# Patient Record
Sex: Female | Born: 1945 | Race: White | Hispanic: No | Marital: Married | Smoking: Never smoker
Health system: Southern US, Community
[De-identification: ages and names within clinical notes are randomized; demographics above are authoritative.]

## PROBLEM LIST (undated history)

## (undated) DIAGNOSIS — C801 Malignant (primary) neoplasm, unspecified: Secondary | ICD-10-CM

## (undated) DIAGNOSIS — Z923 Personal history of irradiation: Secondary | ICD-10-CM

## (undated) DIAGNOSIS — Z9221 Personal history of antineoplastic chemotherapy: Secondary | ICD-10-CM

## (undated) DIAGNOSIS — H269 Unspecified cataract: Secondary | ICD-10-CM

## (undated) DIAGNOSIS — C50919 Malignant neoplasm of unspecified site of unspecified female breast: Secondary | ICD-10-CM

## (undated) HISTORY — PX: BREAST BIOPSY: SHX20

## (undated) HISTORY — DX: Unspecified cataract: H26.9

## (undated) HISTORY — DX: Malignant (primary) neoplasm, unspecified: C80.1

## (undated) HISTORY — PX: ABDOMINAL HYSTERECTOMY: SHX81

---

## 1998-01-29 DIAGNOSIS — Z9221 Personal history of antineoplastic chemotherapy: Secondary | ICD-10-CM

## 1998-01-29 HISTORY — DX: Personal history of antineoplastic chemotherapy: Z92.21

## 1998-07-18 ENCOUNTER — Encounter: Payer: Self-pay | Admitting: General Surgery

## 1998-07-18 ENCOUNTER — Ambulatory Visit (HOSPITAL_COMMUNITY): Admission: RE | Admit: 1998-07-18 | Discharge: 1998-07-18 | Payer: Self-pay | Admitting: General Surgery

## 1998-07-18 DIAGNOSIS — C50919 Malignant neoplasm of unspecified site of unspecified female breast: Secondary | ICD-10-CM

## 1998-07-18 HISTORY — PX: BREAST LUMPECTOMY: SHX2

## 1998-07-18 HISTORY — DX: Malignant neoplasm of unspecified site of unspecified female breast: C50.919

## 1998-07-25 ENCOUNTER — Encounter: Payer: Self-pay | Admitting: General Surgery

## 1998-07-26 ENCOUNTER — Ambulatory Visit (HOSPITAL_COMMUNITY): Admission: RE | Admit: 1998-07-26 | Discharge: 1998-07-26 | Payer: Self-pay | Admitting: General Surgery

## 1998-07-26 ENCOUNTER — Encounter: Payer: Self-pay | Admitting: General Surgery

## 1998-08-08 ENCOUNTER — Encounter: Admission: RE | Admit: 1998-08-08 | Discharge: 1998-11-06 | Payer: Self-pay | Admitting: Radiation Oncology

## 1999-01-30 DIAGNOSIS — Z923 Personal history of irradiation: Secondary | ICD-10-CM

## 1999-01-30 HISTORY — DX: Personal history of irradiation: Z92.3

## 1999-02-06 ENCOUNTER — Encounter: Admission: RE | Admit: 1999-02-06 | Discharge: 1999-05-07 | Payer: Self-pay | Admitting: Radiation Oncology

## 1999-02-09 ENCOUNTER — Encounter: Payer: Self-pay | Admitting: Hematology and Oncology

## 1999-02-09 ENCOUNTER — Encounter: Admission: RE | Admit: 1999-02-09 | Discharge: 1999-02-09 | Payer: Self-pay | Admitting: Hematology and Oncology

## 1999-06-12 ENCOUNTER — Other Ambulatory Visit: Admission: RE | Admit: 1999-06-12 | Discharge: 1999-06-12 | Payer: Self-pay | Admitting: Family Medicine

## 1999-08-16 ENCOUNTER — Encounter: Admission: RE | Admit: 1999-08-16 | Discharge: 1999-08-16 | Payer: Self-pay | Admitting: Hematology and Oncology

## 1999-08-16 ENCOUNTER — Encounter: Payer: Self-pay | Admitting: Hematology and Oncology

## 1999-12-06 ENCOUNTER — Ambulatory Visit (HOSPITAL_COMMUNITY): Admission: RE | Admit: 1999-12-06 | Discharge: 1999-12-06 | Payer: Self-pay | Admitting: Gastroenterology

## 2000-08-12 ENCOUNTER — Ambulatory Visit (HOSPITAL_COMMUNITY): Admission: RE | Admit: 2000-08-12 | Discharge: 2000-08-12 | Payer: Self-pay | Admitting: Hematology and Oncology

## 2000-08-12 ENCOUNTER — Encounter: Payer: Self-pay | Admitting: Hematology and Oncology

## 2000-08-19 ENCOUNTER — Encounter: Payer: Self-pay | Admitting: Hematology and Oncology

## 2000-08-19 ENCOUNTER — Encounter: Admission: RE | Admit: 2000-08-19 | Discharge: 2000-08-19 | Payer: Self-pay | Admitting: Hematology and Oncology

## 2001-08-21 ENCOUNTER — Encounter: Admission: RE | Admit: 2001-08-21 | Discharge: 2001-08-21 | Payer: Self-pay | Admitting: Hematology and Oncology

## 2001-08-21 ENCOUNTER — Encounter: Payer: Self-pay | Admitting: Hematology and Oncology

## 2002-09-09 ENCOUNTER — Encounter: Admission: RE | Admit: 2002-09-09 | Discharge: 2002-09-09 | Payer: Self-pay | Admitting: General Surgery

## 2002-09-09 ENCOUNTER — Encounter: Payer: Self-pay | Admitting: General Surgery

## 2003-09-20 ENCOUNTER — Encounter: Admission: RE | Admit: 2003-09-20 | Discharge: 2003-09-20 | Payer: Self-pay | Admitting: General Surgery

## 2004-10-03 ENCOUNTER — Encounter: Admission: RE | Admit: 2004-10-03 | Discharge: 2004-10-03 | Payer: Self-pay | Admitting: General Surgery

## 2004-11-17 ENCOUNTER — Encounter: Admission: RE | Admit: 2004-11-17 | Discharge: 2004-11-17 | Payer: Self-pay | Admitting: General Surgery

## 2005-10-10 ENCOUNTER — Encounter: Admission: RE | Admit: 2005-10-10 | Discharge: 2005-10-10 | Payer: Self-pay | Admitting: General Surgery

## 2006-10-14 ENCOUNTER — Encounter: Admission: RE | Admit: 2006-10-14 | Discharge: 2006-10-14 | Payer: Self-pay | Admitting: Emergency Medicine

## 2007-10-23 ENCOUNTER — Encounter: Admission: RE | Admit: 2007-10-23 | Discharge: 2007-10-23 | Payer: Self-pay | Admitting: Emergency Medicine

## 2007-10-29 ENCOUNTER — Other Ambulatory Visit: Admission: RE | Admit: 2007-10-29 | Discharge: 2007-10-29 | Payer: Self-pay | Admitting: Obstetrics and Gynecology

## 2008-10-28 ENCOUNTER — Encounter: Admission: RE | Admit: 2008-10-28 | Discharge: 2008-10-28 | Payer: Self-pay | Admitting: Obstetrics and Gynecology

## 2009-05-12 ENCOUNTER — Other Ambulatory Visit: Admission: RE | Admit: 2009-05-12 | Discharge: 2009-05-12 | Payer: Self-pay | Admitting: Obstetrics and Gynecology

## 2009-09-02 ENCOUNTER — Ambulatory Visit (HOSPITAL_COMMUNITY): Admission: RE | Admit: 2009-09-02 | Discharge: 2009-09-02 | Payer: Self-pay | Admitting: Orthopedic Surgery

## 2009-11-10 ENCOUNTER — Encounter: Admission: RE | Admit: 2009-11-10 | Discharge: 2009-11-10 | Payer: Self-pay | Admitting: Family Medicine

## 2010-02-19 ENCOUNTER — Encounter: Payer: Self-pay | Admitting: General Surgery

## 2010-05-31 ENCOUNTER — Ambulatory Visit (HOSPITAL_BASED_OUTPATIENT_CLINIC_OR_DEPARTMENT_OTHER)
Admission: RE | Admit: 2010-05-31 | Discharge: 2010-05-31 | Disposition: A | Discharge status: Home / Self Care | Payer: Medicare Other | Source: Ambulatory Visit | Attending: Orthopedic Surgery | Admitting: Orthopedic Surgery

## 2010-05-31 DIAGNOSIS — Z01810 Encounter for preprocedural cardiovascular examination: Secondary | ICD-10-CM | POA: Insufficient documentation

## 2010-05-31 DIAGNOSIS — M2240 Chondromalacia patellae, unspecified knee: Secondary | ICD-10-CM | POA: Insufficient documentation

## 2010-05-31 DIAGNOSIS — X58XXXA Exposure to other specified factors, initial encounter: Secondary | ICD-10-CM | POA: Insufficient documentation

## 2010-05-31 DIAGNOSIS — Z01812 Encounter for preprocedural laboratory examination: Secondary | ICD-10-CM | POA: Insufficient documentation

## 2010-05-31 DIAGNOSIS — S83289A Other tear of lateral meniscus, current injury, unspecified knee, initial encounter: Secondary | ICD-10-CM | POA: Insufficient documentation

## 2010-05-31 LAB — POCT I-STAT 4, (NA,K, GLUC, HGB,HCT)
Glucose, Bld: 98 mg/dL (ref 70–99)
HCT: 39 % (ref 36.0–46.0)
Hemoglobin: 13.3 g/dL (ref 12.0–15.0)
Potassium: 3.9 mEq/L (ref 3.5–5.1)
Sodium: 137 mEq/L (ref 135–145)

## 2010-06-05 NOTE — Op Note (Signed)
Virginia Sanchez, SETTLE NO.:  [id number]  MEDICAL RECORD NO.:  [id number]           PATIENT TYPE:  LOCATION:                                 FACILITY:  PHYSICIAN:  Ollen Gross, M.D.         DATE OF BIRTH:  DATE OF PROCEDURE:  05/31/2010 DATE OF DISCHARGE:                              OPERATIVE REPORT   PREOPERATIVE DIAGNOSIS:  Left knee lateral meniscal tear.  POSTOPERATIVE DIAGNOSES:  Left knee lateral meniscal tear plus chondral defects medial and lateral compartments.  PROCEDURE:  Left knee arthroscopy with meniscal debridement and chondroplasty.  SURGEON:  Ollen Gross, M.D.  ASSISTANT:  None.  ANESTHESIA:  General.  BLOOD LOSS:  Minimal.  DRAINS:  None.  COMPLICATIONS:  None.  CONDITION:  Stable to recovery.  BRIEF CLINICAL NOTE:  [name] is a 65 year old female with a long history of significant pain, mechanical symptoms in the left knee.  Exam and history suggested lateral meniscal tear confirmed by MRI.  There is also a question of a medial tear in the MRI.  She presents now for arthroscopy and debridement.  PROCEDURE IN DETAIL:  After successful administration of general anesthetic, the patient's left lower extremity was prepped and draped in the usual sterile fashion.  Standard superomedial and inferolateral incisions were made, inflow cannula passed, superomedial cannula passed inferolateral.  Arthroscopic visualization proceeds.  Undersurface of patella, had some grade 1 and grade 2 change but no full-thickness defects.  No unstable cartilage.  The trochlea had some mild changes also, but no full thickness cartilaginous defects and no unstable cartilage.  The mediolateral gutters were visualized and no loose bodies.  Flexion valgus force applied to knee, and the medial compartment was entered, about 1 x 1 cm area where this cartilage coming off the medial femoral condyle.  The spinal needle was used to localize the  inferomedial portal.  Small incision made and dilator placed.  Then debrided the chondral defect back to stable base with stable cartilaginous edges.  The meniscus was inspected, and there were no tears in the medial meniscus.  Intercondylar notch was visualized.  The ACL was normal.  Lateral compartment was entered, and there was a significant amount of chondromalacia and unstable cartilage on the tibial plateau surface, but not on the femoral side.  I debrided this back to stable cartilaginous base on the tibial plateau with a shaver. There was a tear in the anterior horn and body of the lateral meniscus. It was debrided back to stable base with the shaver and the baskets and sealed off with the ArthroCare.  The rest of the lateral compartment did not have abnormality.  The joints again inspected.  No other tears, defects, or loose bodies were noted.  The arthroscopic clips were removed from the inferior portals, which were closed with interrupted 4- 0 nylon.  A 20 cc of 0.25% Marcaine with epinephrine injected through the inflow cannula, __________ removed and portal closed with nylon. Incisions were cleaned and dried and a bulky sterile dressing was applied.  She was then awakened and transported to recovery in stable condition.  Ollen Gross, M.D.     FA/MEDQ  D:  05/31/2010  T:  05/31/2010  Job:  [id number]  Electronically Signed by Ollen Gross M.D. on 06/05/2010 06:47:39 PM

## 2010-06-16 NOTE — Procedures (Signed)
[place]  Patient:    Virginia Sanchez, Virginia Sanchez                      MRN: [id number] Proc. Date: 12/06/99 Adm. Date:  [id number] Attending:  Orland Mustard CC:         Miquel Dunn. Catha Gosselin, M.D.  Duncan Dull, M.D.   Procedure Report  PROCEDURE PERFORMED:  Colonoscopy.  ENDOSCOPIST:  Llana Aliment. Randa Evens, M.D.  MEDICATIONS USED:  Fentanyl 70 mcg, Versed 2 mg IV.  INSTRUMENT:  INDICATIONS:  Hematochezia.  DESCRIPTION OF PROCEDURE:  The procedure had been explained to the patient and consent obtained.  With the patient in the left lateral decubitus position, the pediatric Olympus video colonoscope was inserted and advanced under direct visualization.  The prep was excellent and we were able to advance to the cecum using some abdominal pressure and position changes.  The ileocecal valve and appendiceal orifice were identified.  The scope was withdrawn.  The cecum, ascending colon, hepatic flexure, transverse colon, splenic flexure, descending and sigmoid colon were seen well upon withdrawal.  No polyps or other lesions were seen.  The scope was withdrawn into the rectum.  Internal hemorrhoids were seen in the anal canal.  This was confirmed by the retroflex view.  The patient tolerated the procedure well.  Maintained on low flow oxygen and pulse oximeter throughout the procedure with no obvious problem.  ASSESSMENT:  Internal hemorrhoids, presumably the source of her bleeding. Given instructions about hemorrhoids and see back in the office on an as needed basis.  PLAN: DD:  12/06/99 TD:  12/06/99 Job: [id number]

## 2010-07-13 ENCOUNTER — Other Ambulatory Visit: Payer: Self-pay | Admitting: Family Medicine

## 2010-07-13 DIAGNOSIS — M8580 Other specified disorders of bone density and structure, unspecified site: Secondary | ICD-10-CM

## 2010-07-20 ENCOUNTER — Other Ambulatory Visit: Payer: 59

## 2010-11-20 ENCOUNTER — Other Ambulatory Visit: Payer: Self-pay | Admitting: Family Medicine

## 2010-11-20 DIAGNOSIS — Z1231 Encounter for screening mammogram for malignant neoplasm of breast: Secondary | ICD-10-CM

## 2010-12-20 ENCOUNTER — Other Ambulatory Visit: Payer: 59

## 2010-12-20 ENCOUNTER — Ambulatory Visit: Payer: 59

## 2010-12-27 ENCOUNTER — Ambulatory Visit
Admission: RE | Admit: 2010-12-27 | Discharge: 2010-12-27 | Disposition: A | Discharge status: Home / Self Care | Payer: Medicare Other | Source: Ambulatory Visit | Attending: Family Medicine | Admitting: Family Medicine

## 2010-12-27 DIAGNOSIS — M8580 Other specified disorders of bone density and structure, unspecified site: Secondary | ICD-10-CM

## 2010-12-27 DIAGNOSIS — Z1231 Encounter for screening mammogram for malignant neoplasm of breast: Secondary | ICD-10-CM

## 2011-11-07 ENCOUNTER — Other Ambulatory Visit: Payer: Self-pay | Admitting: Family Medicine

## 2011-11-07 DIAGNOSIS — Z1231 Encounter for screening mammogram for malignant neoplasm of breast: Secondary | ICD-10-CM

## 2012-01-03 ENCOUNTER — Ambulatory Visit
Admission: RE | Admit: 2012-01-03 | Discharge: 2012-01-03 | Disposition: A | Discharge status: Home / Self Care | Payer: Medicare Other | Source: Ambulatory Visit | Attending: Family Medicine | Admitting: Family Medicine

## 2012-01-03 DIAGNOSIS — Z1231 Encounter for screening mammogram for malignant neoplasm of breast: Secondary | ICD-10-CM

## 2012-04-29 ENCOUNTER — Other Ambulatory Visit: Payer: Self-pay | Admitting: Family Medicine

## 2012-04-29 ENCOUNTER — Ambulatory Visit
Admission: RE | Admit: 2012-04-29 | Discharge: 2012-04-29 | Disposition: A | Discharge status: Home / Self Care | Payer: Medicare Other | Source: Ambulatory Visit | Attending: Family Medicine | Admitting: Family Medicine

## 2012-04-29 DIAGNOSIS — R5383 Other fatigue: Secondary | ICD-10-CM

## 2012-04-29 DIAGNOSIS — R5381 Other malaise: Secondary | ICD-10-CM

## 2012-04-29 DIAGNOSIS — R0609 Other forms of dyspnea: Secondary | ICD-10-CM

## 2012-04-29 DIAGNOSIS — R0989 Other specified symptoms and signs involving the circulatory and respiratory systems: Secondary | ICD-10-CM

## 2012-06-19 ENCOUNTER — Ambulatory Visit (INDEPENDENT_AMBULATORY_CARE_PROVIDER_SITE_OTHER): Payer: Medicare Other

## 2012-06-19 ENCOUNTER — Other Ambulatory Visit: Payer: Self-pay | Admitting: Orthopedic Surgery

## 2012-06-19 DIAGNOSIS — M25561 Pain in right knee: Secondary | ICD-10-CM

## 2012-06-19 DIAGNOSIS — M25569 Pain in unspecified knee: Secondary | ICD-10-CM

## 2012-11-14 ENCOUNTER — Other Ambulatory Visit: Payer: Self-pay

## 2012-11-14 DIAGNOSIS — Z9889 Other specified postprocedural states: Secondary | ICD-10-CM

## 2012-11-14 DIAGNOSIS — Z1231 Encounter for screening mammogram for malignant neoplasm of breast: Secondary | ICD-10-CM

## 2012-11-14 DIAGNOSIS — Z853 Personal history of malignant neoplasm of breast: Secondary | ICD-10-CM

## 2012-11-24 ENCOUNTER — Other Ambulatory Visit: Payer: Self-pay | Admitting: Family Medicine

## 2012-11-24 DIAGNOSIS — M810 Age-related osteoporosis without current pathological fracture: Secondary | ICD-10-CM

## 2013-01-13 ENCOUNTER — Ambulatory Visit
Admission: RE | Admit: 2013-01-13 | Discharge: 2013-01-13 | Disposition: A | Discharge status: Home / Self Care | Payer: Medicare Other | Source: Ambulatory Visit | Attending: Family Medicine | Admitting: Family Medicine

## 2013-01-13 ENCOUNTER — Ambulatory Visit: Payer: Medicare Other

## 2013-01-13 ENCOUNTER — Ambulatory Visit
Admission: RE | Admit: 2013-01-13 | Discharge: 2013-01-13 | Disposition: A | Discharge status: Home / Self Care | Payer: Medicare Other | Source: Ambulatory Visit

## 2013-01-13 DIAGNOSIS — Z1231 Encounter for screening mammogram for malignant neoplasm of breast: Secondary | ICD-10-CM

## 2013-01-13 DIAGNOSIS — Z9889 Other specified postprocedural states: Secondary | ICD-10-CM

## 2013-01-13 DIAGNOSIS — Z853 Personal history of malignant neoplasm of breast: Secondary | ICD-10-CM

## 2013-01-13 DIAGNOSIS — M810 Age-related osteoporosis without current pathological fracture: Secondary | ICD-10-CM

## 2013-09-15 ENCOUNTER — Other Ambulatory Visit: Payer: Self-pay

## 2013-09-15 DIAGNOSIS — Z1231 Encounter for screening mammogram for malignant neoplasm of breast: Secondary | ICD-10-CM

## 2014-01-19 ENCOUNTER — Ambulatory Visit
Admission: RE | Admit: 2014-01-19 | Discharge: 2014-01-19 | Disposition: A | Discharge status: Home / Self Care | Payer: Medicare HMO | Source: Ambulatory Visit

## 2014-01-19 DIAGNOSIS — Z1231 Encounter for screening mammogram for malignant neoplasm of breast: Secondary | ICD-10-CM

## 2014-05-17 ENCOUNTER — Ambulatory Visit (INDEPENDENT_AMBULATORY_CARE_PROVIDER_SITE_OTHER): Payer: Medicare HMO | Admitting: Podiatry

## 2014-05-17 ENCOUNTER — Encounter: Payer: Self-pay | Admitting: Podiatry

## 2014-05-17 DIAGNOSIS — B351 Tinea unguium: Secondary | ICD-10-CM

## 2014-05-17 NOTE — Patient Instructions (Signed)
The nail fragments are submitted for fungal culture and stain. The results are available anywhere from 2-4 weeks. Our office will contact you with the results

## 2014-05-17 NOTE — Progress Notes (Signed)
   Subjective:    Patient ID: SAXON CROSBY, female    DOB: 1945-08-19, 69 y.o.   MRN: [id number]  HPI  N-SORE, THICK L-B/L TOENAILS D-LONG TERM O-SLOWLY C-WORSE A-PRESSURE T-TRIM  Review of Systems  Skin: Positive for color change.  All other systems reviewed and are negative.      Objective:   Physical Exam  Orientated 3 patient presents with husband the treatment room today  Vascular: DP and PT pulses 2/4 bilaterally  Neurological: Sensation to 10 g monofilament wire intact 5/5 bilaterally Ankle reflex equal and reactive bilaterally Vibratory sensation intact bilaterally  Dermatological: The hallux toenails are elongated, brittle dystrophic and tender to palpation The remaining nail plates are normal trophic  Musculoskeletal: No deformities noted bilaterally There is no restriction ankle, subtalar, midtarsal joints    Assessment & Plan:   Assessment: Satisfactory neurovascular status Mycotic hallux toenails  Plan: Discuss treatment options including no treatment versus active treatment including topical and oral medication and permanent nail removal. Patient is interested in active treatment and would consider oral medication  The right and left hallux nails were debrided and the nail fragments submitted for PAS and fungal culture  Notify patient on receipt of lab results

## 2014-06-22 ENCOUNTER — Telehealth: Payer: Self-pay | Admitting: Podiatry

## 2014-06-22 NOTE — Telephone Encounter (Signed)
Patient called to check on her culture results. Please advise

## 2014-06-23 NOTE — Telephone Encounter (Addendum)
Left message requesting pt to call our office for results or fungal culture.  Dr. Amalia Hailey states fungal culture is negative for fungus, no treatment is necessary, may want to allow toenail to regrow and retest.  Informed pt the fungal test were negative and gave her Dr. Phoebe Perch recommendations.

## 2014-07-26 ENCOUNTER — Other Ambulatory Visit: Payer: Self-pay

## 2014-10-25 ENCOUNTER — Other Ambulatory Visit: Payer: Self-pay | Admitting: Family Medicine

## 2014-10-25 DIAGNOSIS — R222 Localized swelling, mass and lump, trunk: Secondary | ICD-10-CM

## 2014-10-29 ENCOUNTER — Ambulatory Visit
Admission: RE | Admit: 2014-10-29 | Discharge: 2014-10-29 | Disposition: A | Discharge status: Home / Self Care | Payer: Medicare HMO | Source: Ambulatory Visit | Attending: Family Medicine | Admitting: Family Medicine

## 2014-10-29 DIAGNOSIS — R222 Localized swelling, mass and lump, trunk: Secondary | ICD-10-CM

## 2014-10-29 MED ORDER — IOPAMIDOL (ISOVUE-300) INJECTION 61%
75.0000 mL | Freq: Once | INTRAVENOUS | Status: AC | PRN
  Administered 2014-10-29: 75 mL via INTRAVENOUS

## 2015-01-06 ENCOUNTER — Other Ambulatory Visit: Payer: Self-pay

## 2015-01-06 DIAGNOSIS — Z1231 Encounter for screening mammogram for malignant neoplasm of breast: Secondary | ICD-10-CM

## 2015-05-04 ENCOUNTER — Ambulatory Visit
Admission: RE | Admit: 2015-05-04 | Discharge: 2015-05-04 | Disposition: A | Discharge status: Home / Self Care | Payer: Medicare Other | Source: Ambulatory Visit

## 2015-05-04 DIAGNOSIS — Z1231 Encounter for screening mammogram for malignant neoplasm of breast: Secondary | ICD-10-CM

## 2016-04-27 ENCOUNTER — Other Ambulatory Visit: Payer: Self-pay | Admitting: Family Medicine

## 2016-04-27 DIAGNOSIS — Z1231 Encounter for screening mammogram for malignant neoplasm of breast: Secondary | ICD-10-CM

## 2016-05-22 ENCOUNTER — Other Ambulatory Visit: Payer: Self-pay | Admitting: Family Medicine

## 2016-05-22 DIAGNOSIS — M810 Age-related osteoporosis without current pathological fracture: Secondary | ICD-10-CM

## 2016-05-24 ENCOUNTER — Ambulatory Visit: Payer: Medicare Other

## 2016-06-12 ENCOUNTER — Ambulatory Visit
Admission: RE | Admit: 2016-06-12 | Discharge: 2016-06-12 | Disposition: A | Discharge status: Home / Self Care | Payer: Medicare Other | Source: Ambulatory Visit | Attending: Family Medicine | Admitting: Family Medicine

## 2016-06-12 DIAGNOSIS — M810 Age-related osteoporosis without current pathological fracture: Secondary | ICD-10-CM

## 2016-06-12 DIAGNOSIS — Z1231 Encounter for screening mammogram for malignant neoplasm of breast: Secondary | ICD-10-CM

## 2016-06-12 HISTORY — DX: Personal history of antineoplastic chemotherapy: Z92.21

## 2016-06-12 HISTORY — DX: Malignant neoplasm of unspecified site of unspecified female breast: C50.919

## 2016-06-12 HISTORY — DX: Personal history of irradiation: Z92.3

## 2017-05-10 ENCOUNTER — Other Ambulatory Visit: Payer: Self-pay | Admitting: Family Medicine

## 2017-05-10 DIAGNOSIS — Z1231 Encounter for screening mammogram for malignant neoplasm of breast: Secondary | ICD-10-CM

## 2017-05-23 ENCOUNTER — Telehealth: Payer: Self-pay | Admitting: Hematology and Oncology

## 2017-05-23 ENCOUNTER — Other Ambulatory Visit: Payer: Self-pay | Admitting: Family Medicine

## 2017-05-23 ENCOUNTER — Ambulatory Visit
Admission: RE | Admit: 2017-05-23 | Discharge: 2017-05-23 | Disposition: A | Discharge status: Home / Self Care | Payer: Medicare Other | Source: Ambulatory Visit | Attending: Family Medicine | Admitting: Family Medicine

## 2017-05-23 ENCOUNTER — Encounter: Payer: Self-pay | Admitting: Hematology and Oncology

## 2017-05-23 DIAGNOSIS — R0789 Other chest pain: Secondary | ICD-10-CM

## 2017-05-23 NOTE — Telephone Encounter (Signed)
Pt has been scheduled to see Dr. Lindi Adie on 5/23 at 1pm. Address verified. Letter mailed.

## 2017-06-13 ENCOUNTER — Ambulatory Visit
Admission: RE | Admit: 2017-06-13 | Discharge: 2017-06-13 | Disposition: A | Discharge status: Home / Self Care | Payer: Medicare Other | Source: Ambulatory Visit | Attending: Family Medicine | Admitting: Family Medicine

## 2017-06-13 DIAGNOSIS — Z1231 Encounter for screening mammogram for malignant neoplasm of breast: Secondary | ICD-10-CM

## 2017-06-14 ENCOUNTER — Other Ambulatory Visit: Payer: Self-pay | Admitting: Family Medicine

## 2017-06-14 DIAGNOSIS — R928 Other abnormal and inconclusive findings on diagnostic imaging of breast: Secondary | ICD-10-CM

## 2017-06-18 ENCOUNTER — Ambulatory Visit
Admission: RE | Admit: 2017-06-18 | Discharge: 2017-06-18 | Disposition: A | Discharge status: Home / Self Care | Payer: Medicare Other | Source: Ambulatory Visit | Attending: Family Medicine | Admitting: Family Medicine

## 2017-06-18 DIAGNOSIS — R928 Other abnormal and inconclusive findings on diagnostic imaging of breast: Secondary | ICD-10-CM

## 2017-06-20 ENCOUNTER — Inpatient Hospital Stay: Payer: Medicare Other | Attending: Hematology and Oncology | Admitting: Hematology and Oncology

## 2017-06-20 ENCOUNTER — Telehealth: Payer: Self-pay | Admitting: Hematology and Oncology

## 2017-06-20 DIAGNOSIS — Z923 Personal history of irradiation: Secondary | ICD-10-CM | POA: Insufficient documentation

## 2017-06-20 DIAGNOSIS — Z853 Personal history of malignant neoplasm of breast: Secondary | ICD-10-CM

## 2017-06-20 DIAGNOSIS — Z9221 Personal history of antineoplastic chemotherapy: Secondary | ICD-10-CM | POA: Diagnosis not present

## 2017-06-20 DIAGNOSIS — Z79899 Other long term (current) drug therapy: Secondary | ICD-10-CM | POA: Diagnosis not present

## 2017-06-20 DIAGNOSIS — C50412 Malignant neoplasm of upper-outer quadrant of left female breast: Secondary | ICD-10-CM | POA: Insufficient documentation

## 2017-06-20 DIAGNOSIS — Z7982 Long term (current) use of aspirin: Secondary | ICD-10-CM | POA: Diagnosis not present

## 2017-06-20 DIAGNOSIS — Z171 Estrogen receptor negative status [ER-]: Secondary | ICD-10-CM

## 2017-06-20 NOTE — Assessment & Plan Note (Signed)
06/17/1998: Left breast cancer treated with lumpectomy, ER negative, stage IIa, 0/1 lymph node negative, treated with adjuvant chemotherapy from August 2000 to January 2001, adjuvant radiation from February 2001 to March 2001 33 fractions by Dr. Sondra Come  Breast cancer surveillance: 1.  Mammogram 06/18/2017: Benign dystrophic calcifications around the lumpectomy scar 2. Breast exam 06/20/2017: Benign  Return to clinic in 1 year for long-term survivorship clinic follow-up with Mendel Ryder for breast cancer surveillance

## 2017-06-20 NOTE — Progress Notes (Signed)
[facility] CONSULT NOTE  Patient Care Team: Christain Sacramento, MD as PCP - General (Family Medicine)  CHIEF COMPLAINTS/PURPOSE OF CONSULTATION:  Surveillance of previously diagnosed breast cancer  HISTORY OF PRESENTING ILLNESS:  Virginia Sanchez 72 y.o. female is here because of a prior history of left breast cancer treated at [facility] with lumpectomy followed by adjuvant chemotherapy and radiation.  She has been on surveillance but was lost first follow-up sometime ago.  She was referred back to [place] to continue with her surveillance exams by her primary care physician.  Recent mammograms were benign.  She denies any lumps or nodules in the breast.  She is here today accompanied by her husband.  She denies any lumps or nodules in the breast.  I reviewed her records extensively and collaborated the history with the patient.  SUMMARY OF ONCOLOGIC HISTORY:   Malignant neoplasm of upper-outer quadrant of left breast in female, estrogen receptor negative ([place])   06/17/1998 Initial Diagnosis    Left breast cancer treated with lumpectomy, ER negative, stage IIa, 0/1 lymph node negative, treated with adjuvant chemotherapy from August 2000 to January 2001, adjuvant radiation from February 2001 to March 2001 33 fractions by Dr. Sondra Come      MEDICAL HISTORY:  Past Medical History:  Diagnosis Date  . Breast cancer ([place]) 07/18/1998   Left  . Cancer ([place])   . Cataract   . Personal history of chemotherapy 2000  . Personal history of radiation therapy 2001   Left    SURGICAL HISTORY: Past Surgical History:  Procedure Laterality Date  . ABDOMINAL HYSTERECTOMY    . BREAST BIOPSY    . BREAST LUMPECTOMY Left 07/18/1998    SOCIAL HISTORY: Social History   Socioeconomic History  . Marital status: Married    Spouse name: Not on file  . Number of children: Not on file  . Years of education: Not on file  . Highest education level: Not on file  Occupational History  . Not on  file  Social Needs  . Financial resource strain: Not on file  . Food insecurity:    Worry: Not on file    Inability: Not on file  . Transportation needs:    Medical: Not on file    Non-medical: Not on file  Tobacco Use  . Smoking status: Never Smoker  Substance and Sexual Activity  . Alcohol use: No    Alcohol/week: 0.0 oz  . Drug use: No  . Sexual activity: Not on file  Lifestyle  . Physical activity:    Days per week: Not on file    Minutes per session: Not on file  . Stress: Not on file  Relationships  . Social connections:    Talks on phone: Not on file    Gets together: Not on file    Attends religious service: Not on file    Active member of club or organization: Not on file    Attends meetings of clubs or organizations: Not on file    Relationship status: Not on file  . Intimate partner violence:    Fear of current or ex partner: Not on file    Emotionally abused: Not on file    Physically abused: Not on file    Forced sexual activity: Not on file  Other Topics Concern  . Not on file  Social History Narrative  . Not on file    FAMILY HISTORY: No family history on file.  ALLERGIES:  has  No Known Allergies.  MEDICATIONS:  Current Outpatient Medications  Medication Sig Dispense Refill  . alendronate (FOSAMAX) 70 MG tablet     . aspirin 81 MG tablet Take 81 mg by mouth daily.    Marland Kitchen atorvastatin (LIPITOR) 10 MG tablet     . Cholecalciferol (VITAMIN D PO) Take by mouth.    . L-LYSINE PO Take by mouth.    . levothyroxine (SYNTHROID, LEVOTHROID) 75 MCG tablet     . metoprolol succinate (TOPROL-XL) 50 MG 24 hr tablet      No current facility-administered medications for this visit.     REVIEW OF SYSTEMS:   Constitutional: Denies fevers, chills or abnormal night sweats Eyes: Denies blurriness of vision, double vision or watery eyes Ears, nose, mouth, throat, and face: Denies mucositis or sore throat Respiratory: Denies cough, dyspnea or  wheezes Cardiovascular: Denies palpitation, chest discomfort or lower extremity swelling Gastrointestinal:  Denies nausea, heartburn or change in bowel habits Skin: Denies abnormal skin rashes Lymphatics: Denies new lymphadenopathy or easy bruising Neurological:Denies numbness, tingling or new weaknesses Behavioral/Psych: Mood is stable, no new changes  Breast:  Denies any palpable lumps or discharge All other systems were reviewed with the patient and are negative.  PHYSICAL EXAMINATION: ECOG PERFORMANCE STATUS: 1 - Symptomatic but completely ambulatory  Vitals:   06/20/17 1311  BP: (!) 169/86  Pulse: 74  Resp: 18  Temp: 97.8 F (36.6 C)  SpO2: 100%   Filed Weights   06/20/17 1311  Weight: 194 lb 14.4 oz (88.4 kg)    GENERAL:alert, no distress and comfortable SKIN: skin color, texture, turgor are normal, no rashes or significant lesions EYES: normal, conjunctiva are pink and non-injected, sclera clear OROPHARYNX:no exudate, no erythema and lips, buccal mucosa, and tongue normal  NECK: supple, thyroid normal size, non-tender, without nodularity LYMPH:  no palpable lymphadenopathy in the cervical, axillary or inguinal LUNGS: clear to auscultation and percussion with normal breathing effort HEART: regular rate & rhythm and no murmurs and no lower extremity edema ABDOMEN:abdomen soft, non-tender and normal bowel sounds Musculoskeletal:no cyanosis of digits and no clubbing  PSYCH: alert & oriented x 3 with fluent speech NEURO: no focal motor/sensory deficits BREAST: No palpable nodules in breast. No palpable axillary or supraclavicular lymphadenopathy (exam performed in the presence of a chaperone)   LABORATORY DATA:  I have reviewed the data as listed Lab Results  Component Value Date   HGB 13.3 05/31/2010   HCT 39.0 05/31/2010   Lab Results  Component Value Date   NA 137 05/31/2010   K 3.9 05/31/2010    RADIOGRAPHIC STUDIES: I have personally reviewed the  radiological reports and agreed with the findings in the report.  ASSESSMENT AND PLAN:  Malignant neoplasm of upper-outer quadrant of left breast in female, estrogen receptor negative ([place]) 06/17/1998: Left breast cancer treated with lumpectomy, ER negative, stage IIa, 0/1 lymph node negative, treated with adjuvant chemotherapy from August 2000 to January 2001, adjuvant radiation from February 2001 to March 2001 33 fractions by Dr. Sondra Come  Breast cancer surveillance: 1.  Mammogram 06/18/2017: Benign dystrophic calcifications around the lumpectomy scar 2. Breast exam 06/20/2017: Benign  Return to clinic in 1 year for long-term survivorship clinic follow-up with Mendel Ryder for breast cancer surveillance   All questions were answered. The patient knows to call the clinic with any problems, questions or concerns.    Harriette Ohara, MD 06/20/17

## 2017-06-20 NOTE — Telephone Encounter (Signed)
Gave patient AVs and calendar of upcoming may 2020 appointments °

## 2018-04-15 ENCOUNTER — Other Ambulatory Visit: Payer: Self-pay | Admitting: Gastroenterology

## 2018-04-15 DIAGNOSIS — R1011 Right upper quadrant pain: Secondary | ICD-10-CM

## 2018-04-18 ENCOUNTER — Ambulatory Visit
Admission: RE | Admit: 2018-04-18 | Discharge: 2018-04-18 | Disposition: A | Discharge status: Home / Self Care | Payer: Medicare Other | Source: Ambulatory Visit | Attending: Gastroenterology | Admitting: Gastroenterology

## 2018-04-18 DIAGNOSIS — R1011 Right upper quadrant pain: Secondary | ICD-10-CM

## 2018-06-02 ENCOUNTER — Other Ambulatory Visit: Payer: Self-pay | Admitting: Family Medicine

## 2018-06-02 DIAGNOSIS — Z1231 Encounter for screening mammogram for malignant neoplasm of breast: Secondary | ICD-10-CM

## 2018-06-02 DIAGNOSIS — M810 Age-related osteoporosis without current pathological fracture: Secondary | ICD-10-CM

## 2018-06-05 ENCOUNTER — Telehealth: Payer: Self-pay | Admitting: Hematology and Oncology

## 2018-06-05 NOTE — Telephone Encounter (Signed)
Spoke with patient and she agreed to [employer]. For 06/09/18. Emailed step-by-step instructions how to download and access Webex application.

## 2018-06-09 ENCOUNTER — Inpatient Hospital Stay: Payer: Medicare Other | Admitting: Adult Health

## 2018-08-29 ENCOUNTER — Ambulatory Visit
Admission: RE | Admit: 2018-08-29 | Discharge: 2018-08-29 | Disposition: A | Discharge status: Home / Self Care | Payer: Medicare Other | Source: Ambulatory Visit | Attending: Family Medicine | Admitting: Family Medicine

## 2018-08-29 ENCOUNTER — Other Ambulatory Visit: Payer: Self-pay | Admitting: Family Medicine

## 2018-08-29 ENCOUNTER — Other Ambulatory Visit: Payer: Self-pay

## 2018-08-29 DIAGNOSIS — M810 Age-related osteoporosis without current pathological fracture: Secondary | ICD-10-CM

## 2018-08-29 DIAGNOSIS — Z1231 Encounter for screening mammogram for malignant neoplasm of breast: Secondary | ICD-10-CM

## 2018-08-29 DIAGNOSIS — R234 Changes in skin texture: Secondary | ICD-10-CM

## 2018-09-04 ENCOUNTER — Ambulatory Visit
Admission: RE | Admit: 2018-09-04 | Discharge: 2018-09-04 | Disposition: A | Discharge status: Home / Self Care | Payer: Medicare Other | Source: Ambulatory Visit | Attending: Family Medicine | Admitting: Family Medicine

## 2018-09-04 ENCOUNTER — Other Ambulatory Visit: Payer: Self-pay | Admitting: Family Medicine

## 2018-09-04 ENCOUNTER — Other Ambulatory Visit: Payer: Self-pay

## 2018-09-04 DIAGNOSIS — R234 Changes in skin texture: Secondary | ICD-10-CM

## 2019-07-01 ENCOUNTER — Other Ambulatory Visit: Payer: Self-pay | Admitting: Family Medicine

## 2019-07-01 DIAGNOSIS — Z1231 Encounter for screening mammogram for malignant neoplasm of breast: Secondary | ICD-10-CM

## 2019-09-07 ENCOUNTER — Other Ambulatory Visit: Payer: Self-pay

## 2019-09-07 ENCOUNTER — Ambulatory Visit
Admission: RE | Admit: 2019-09-07 | Discharge: 2019-09-07 | Disposition: A | Discharge status: Home / Self Care | Payer: Medicare PPO | Source: Ambulatory Visit | Attending: Family Medicine | Admitting: Family Medicine

## 2019-09-07 DIAGNOSIS — Z1231 Encounter for screening mammogram for malignant neoplasm of breast: Secondary | ICD-10-CM

## 2020-04-06 ENCOUNTER — Other Ambulatory Visit (HOSPITAL_COMMUNITY): Payer: Self-pay | Admitting: Family Medicine

## 2020-04-06 DIAGNOSIS — R928 Other abnormal and inconclusive findings on diagnostic imaging of breast: Secondary | ICD-10-CM

## 2020-05-27 ENCOUNTER — Ambulatory Visit: Payer: Medicare PPO | Attending: Internal Medicine

## 2020-05-27 ENCOUNTER — Other Ambulatory Visit: Payer: Self-pay

## 2020-05-27 ENCOUNTER — Other Ambulatory Visit (HOSPITAL_BASED_OUTPATIENT_CLINIC_OR_DEPARTMENT_OTHER): Payer: Self-pay

## 2020-05-27 DIAGNOSIS — Z23 Encounter for immunization: Secondary | ICD-10-CM

## 2020-05-27 MED ORDER — COVID-19 MRNA VACC (MODERNA) 100 MCG/0.5ML IM SUSP
INTRAMUSCULAR | 0 refills | Status: AC
  Filled 2020-05-27: qty 0.25, 1d supply, fill #0

## 2020-05-27 NOTE — Progress Notes (Signed)
   Covid-19 Vaccination Clinic  Name:  Virginia Sanchez    MRN: [id number] DOB: Mar 29, 1945  05/27/2020  Ms. Vallejo was observed post Covid-19 immunization for 15 minutes without incident. She was provided with Vaccine Information Sheet and instruction to access the V-Safe system.   Ms. Tant was instructed to call 911 with any severe reactions post vaccine: Marland Kitchen Difficulty breathing  . Swelling of face and throat  . A fast heartbeat  . A bad rash all over body  . Dizziness and weakness   Immunizations Administered    Name Date Dose VIS Date Route   Moderna Covid-19 Booster Vaccine 05/27/2020 11:03 AM 0.25 mL 11/18/2019 Intramuscular   Manufacturer: Moderna   Lot: [id number]   [place]: [id number]

## 2020-06-29 ENCOUNTER — Other Ambulatory Visit: Payer: Self-pay | Admitting: Family Medicine

## 2020-06-29 DIAGNOSIS — Z1231 Encounter for screening mammogram for malignant neoplasm of breast: Secondary | ICD-10-CM

## 2020-06-29 DIAGNOSIS — E2839 Other primary ovarian failure: Secondary | ICD-10-CM

## 2020-12-13 ENCOUNTER — Other Ambulatory Visit: Payer: Self-pay

## 2020-12-13 ENCOUNTER — Ambulatory Visit
Admission: RE | Admit: 2020-12-13 | Discharge: 2020-12-13 | Disposition: A | Discharge status: Home / Self Care | Payer: Medicare PPO | Source: Ambulatory Visit | Attending: Family Medicine | Admitting: Family Medicine

## 2020-12-13 DIAGNOSIS — E2839 Other primary ovarian failure: Secondary | ICD-10-CM

## 2020-12-13 DIAGNOSIS — Z1231 Encounter for screening mammogram for malignant neoplasm of breast: Secondary | ICD-10-CM

## 2021-07-25 ENCOUNTER — Ambulatory Visit: Payer: Medicare PPO | Admitting: Podiatry

## 2021-07-25 DIAGNOSIS — L600 Ingrowing nail: Secondary | ICD-10-CM | POA: Diagnosis not present

## 2021-07-25 DIAGNOSIS — M79675 Pain in left toe(s): Secondary | ICD-10-CM

## 2021-08-07 NOTE — Progress Notes (Signed)
Subjective:   Patient ID: Virginia Sanchez, female   DOB: 76 y.o.   MRN: [id number]   HPI 76 year old female presents the office today for concerns of her left big toe becoming ingrown.  Started April 2023 described throbbing sensation.  She gets swelling or redness to the area.  She tried soaking.  Area is tender with pressure.  She has no other concerns.   Review of Systems  All other systems reviewed and are negative.  Past Medical History:  Diagnosis Date   Breast cancer ([place]) 07/18/1998   Left   Cancer ([place])    Cataract    Personal history of chemotherapy 2000   Personal history of radiation therapy 2001   Left    Past Surgical History:  Procedure Laterality Date   ABDOMINAL HYSTERECTOMY     BREAST BIOPSY     BREAST LUMPECTOMY Left 07/18/1998     Current Outpatient Medications:    alendronate (FOSAMAX) 70 MG tablet, , Disp: , Rfl:    aspirin 81 MG tablet, Take 81 mg by mouth daily., Disp: , Rfl:    atorvastatin (LIPITOR) 10 MG tablet, , Disp: , Rfl:    Cholecalciferol (VITAMIN D PO), Take by mouth., Disp: , Rfl:    COVID-19 mRNA vaccine, Moderna, 100 MCG/0.5ML injection, Inject into the muscle., Disp: 0.25 mL, Rfl: 0   L-LYSINE PO, Take by mouth., Disp: , Rfl:    levothyroxine (SYNTHROID, LEVOTHROID) 75 MCG tablet, , Disp: , Rfl:    metoprolol succinate (TOPROL-XL) 50 MG 24 hr tablet, , Disp: , Rfl:   No Known Allergies       Objective:  Physical Exam  General: AAO x3, NAD  Dermatological: Incurvation present to left lateral hallux nail border with tenderness palpation.  Localized edema and erythema likely more from inflammation.  There is no purulence.  No ascending cellulitis.  The nail gel is hypertrophic, dystrophic with discoloration.  No open lesions.  Vascular: Dorsalis Pedis artery and Posterior Tibial artery pedal pulses are 2/4 bilateral with immedate capillary fill time. There is no pain with calf compression, swelling, warmth, erythema.    Neruologic: Grossly intact via light touch bilateral.   Musculoskeletal: . Muscular strength 5/5 in all groups tested bilateral.     Assessment:   Ingrown toenail left hallux lateral nail border     Plan:  -Treatment options discussed including all alternatives, risks, and complications -Etiology of symptoms were discussed -At this time, the patient is requesting partial nail removal with chemical matricectomy to the symptomatic portion of the nail. Risks and complications were discussed with the patient for which they understand and written consent was obtained. Under sterile conditions a total of 3 mL of a mixture of 2% lidocaine plain and 0.5% Marcaine plain was infiltrated in a hallux block fashion. Once anesthetized, the skin was prepped in sterile fashion. A tourniquet was then applied. Next the lateral aspect of hallux nail border was then sharply excised making sure to remove the entire offending nail border. Once the nails were ensured to be removed area was debrided and the underlying skin was intact. There is no purulence identified in the procedure. Next phenol was then applied under standard conditions and copiously irrigated. Silvadene was applied. A dry sterile dressing was applied. After application of the dressing the tourniquet was removed and there is found to be an immediate capillary refill time to the digit. The patient tolerated the procedure well any complications. Post procedure instructions were discussed the patient for  which he verbally understood. Discussed signs/symptoms of infection and directed to call the office immediately should any occur or go directly to the emergency room. In the meantime, encouraged to call the office with any questions, concerns, changes symptoms.  Trula Slade DPM

## 2021-08-08 ENCOUNTER — Ambulatory Visit: Payer: Medicare PPO

## 2021-08-08 DIAGNOSIS — L600 Ingrowing nail: Secondary | ICD-10-CM

## 2021-08-08 MED ORDER — CEPHALEXIN 500 MG PO CAPS: 500.0000 mg | ORAL_CAPSULE | Freq: Three times a day (TID) | ORAL | 0 refills | Status: AC

## 2021-08-08 NOTE — Progress Notes (Signed)
Patient in office for nail check after ingrown removal procedure.   Patient reports the lateral border of left hallux is red, swollen with yellowish drainage.   Patient confirms using epsom salt soaks as directed. Patient denies nausea, vomiting, fever and chills.   Dr. Jacqualyn Posey assessed patient and recommended oral antibiotic treatment.   All questions were answered during visit. Advised patient to call the office with any questions, comments or concerns.

## 2021-08-23 LAB — COLOGUARD: COLOGUARD: NEGATIVE

## 2021-08-28 ENCOUNTER — Ambulatory Visit: Payer: Medicare PPO | Admitting: Podiatry

## 2021-10-16 ENCOUNTER — Ambulatory Visit: Payer: Medicare PPO | Admitting: Podiatry

## 2021-10-16 DIAGNOSIS — B351 Tinea unguium: Secondary | ICD-10-CM | POA: Diagnosis not present

## 2021-10-16 DIAGNOSIS — L600 Ingrowing nail: Secondary | ICD-10-CM

## 2021-10-16 NOTE — Patient Instructions (Addendum)

## 2021-10-16 NOTE — Progress Notes (Signed)
Subjective: Chief Complaint  Patient presents with   Ingrown Toenail    Right hallux ingrown     76 year old female with the above concerns.  She is about this been doing good over the right lateral nail borders causing discomfort.  She is also concerned about the nails being thicker and curving.  She states that she feels it tested for fungus several years ago fungus.  Objective: AAO x3, NAD DP/PT pulses palpable bilaterally, CRT less than 3 seconds Incurvation present to right lateral hallux nail border with tenderness to palpation.  There is trace edema there is no erythema or cellulitis or any drainage or pus.  Status post left partial nail avulsion which appears to be healed.  Trauma hallux nails are hypertrophic, dystrophic nails yellow, brown discoloration. No pain with calf compression, swelling, warmth, erythema  Assessment: Right hallux ingrown toenail, onychomycosis  Plan: -All treatment options discussed with the patient including all alternatives, risks, complications.  -At this time, the patient is requesting partial nail removal with chemical matricectomy to the symptomatic portion of the nail. Risks and complications were discussed with the patient for which they understand and written consent was obtained. Under sterile conditions a total of 3 mL of a mixture of 2% lidocaine plain and 0.5% Marcaine plain was infiltrated in a hallux block fashion. Once anesthetized, the skin was prepped in sterile fashion. A tourniquet was then applied. Next the lateral aspect of hallux nail border was then sharply excised making sure to remove the entire offending nail border. Once the nails were ensured to be removed area was debrided and the underlying skin was intact. There is no purulence identified in the procedure. Next phenol was then applied under standard conditions and copiously irrigated. Silvadene was applied. A dry sterile dressing was applied. After application of the dressing the  tourniquet was removed and there is found to be an immediate capillary refill time to the digit. The patient tolerated the procedure well any complications. Post procedure instructions were discussed the patient for which he verbally understood. Follow-up in one week for nail check or sooner if any problems are to arise. Discussed signs/symptoms of infection and directed to call the office immediately should any occur or go directly to the emergency room. In the meantime, encouraged to call the office with any questions, concerns, changes symptoms. -Clinically does appear to have fungus.  I will [facility] a compound cream [facility] to help with this but also has urea to help with the damage/thickening. -Patient encouraged to call the office with any questions, concerns, change in symptoms.   Trula Slade DPM

## 2021-10-25 ENCOUNTER — Other Ambulatory Visit: Payer: Self-pay | Admitting: Family Medicine

## 2021-10-25 DIAGNOSIS — Z1231 Encounter for screening mammogram for malignant neoplasm of breast: Secondary | ICD-10-CM

## 2021-10-27 ENCOUNTER — Ambulatory Visit: Payer: Medicare PPO | Admitting: Podiatry

## 2021-10-31 ENCOUNTER — Ambulatory Visit: Payer: Medicare PPO | Admitting: Podiatry

## 2021-12-04 ENCOUNTER — Other Ambulatory Visit (HOSPITAL_BASED_OUTPATIENT_CLINIC_OR_DEPARTMENT_OTHER): Payer: Self-pay

## 2021-12-04 MED ORDER — COMIRNATY 30 MCG/0.3ML IM SUSY
PREFILLED_SYRINGE | INTRAMUSCULAR | 0 refills | Status: DC
  Filled 2021-12-04: qty 0.3, 1d supply, fill #0

## 2021-12-13 ENCOUNTER — Ambulatory Visit: Payer: Medicare PPO

## 2021-12-14 ENCOUNTER — Ambulatory Visit: Payer: Medicare PPO

## 2022-01-12 ENCOUNTER — Ambulatory Visit
Admission: RE | Admit: 2022-01-12 | Discharge: 2022-01-12 | Disposition: A | Discharge status: Home / Self Care | Payer: Medicare PPO | Source: Ambulatory Visit | Attending: Family Medicine | Admitting: Family Medicine

## 2022-01-12 DIAGNOSIS — Z1231 Encounter for screening mammogram for malignant neoplasm of breast: Secondary | ICD-10-CM

## 2022-02-05 ENCOUNTER — Other Ambulatory Visit (HOSPITAL_BASED_OUTPATIENT_CLINIC_OR_DEPARTMENT_OTHER): Payer: Self-pay

## 2022-02-05 MED ORDER — TETANUS-DIPHTH-ACELL PERTUSSIS 5-2.5-18.5 LF-MCG/0.5 IM SUSY
PREFILLED_SYRINGE | INTRAMUSCULAR | 0 refills | Status: DC
  Filled 2022-02-05: qty 0.5, 1d supply, fill #0

## 2022-03-29 IMAGING — MG MM DIGITAL SCREENING BILAT W/ TOMO AND CAD
8 series · 8 of 24 positions shown · non-contrast
Comparison: Previous exam(s).

CLINICAL DATA: Screening.

EXAM:
DIGITAL SCREENING BILATERAL MAMMOGRAM WITH TOMOSYNTHESIS AND CAD
TECHNIQUE: Bilateral screening digital craniocaudal and mediolateral oblique
mammograms were obtained. Bilateral screening digital breast
tomosynthesis was performed. The images were evaluated with
computer-aided detection.

[R MLO synth-2D]
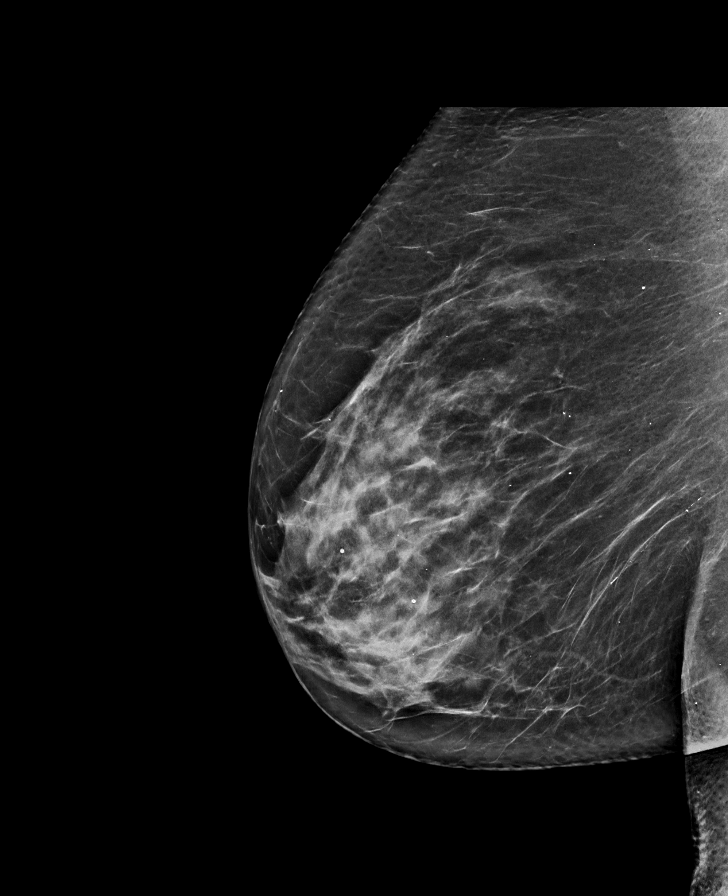

[L MLO synth-2D]
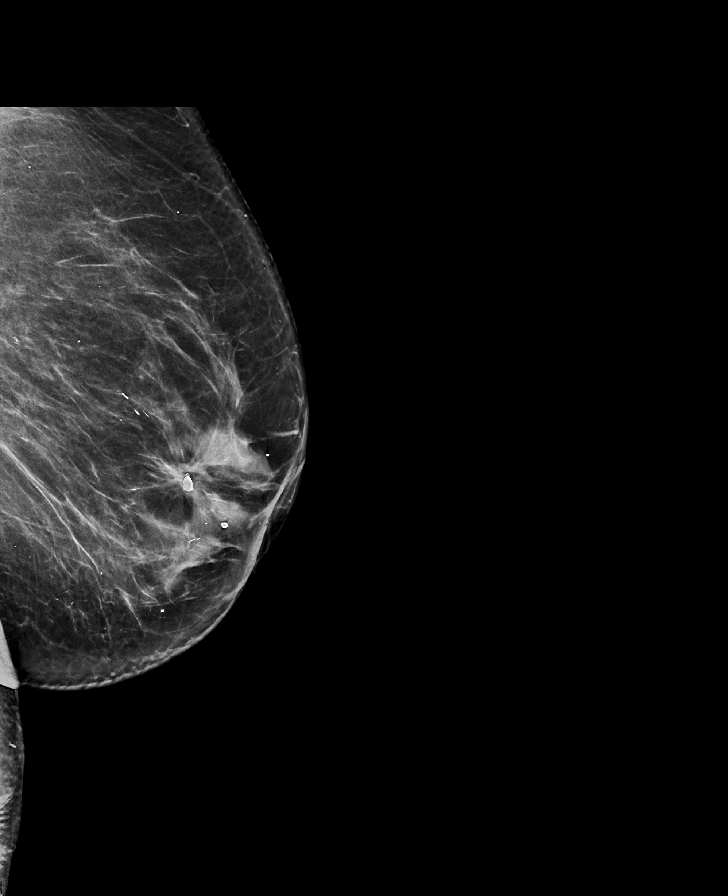

[R CC synth-2D]
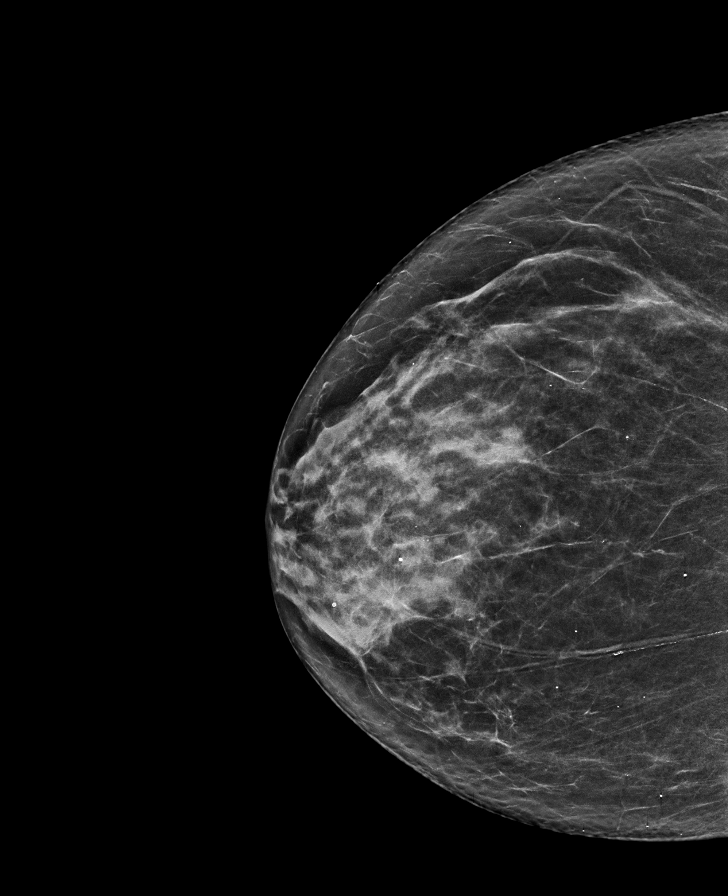

[L CC synth-2D]
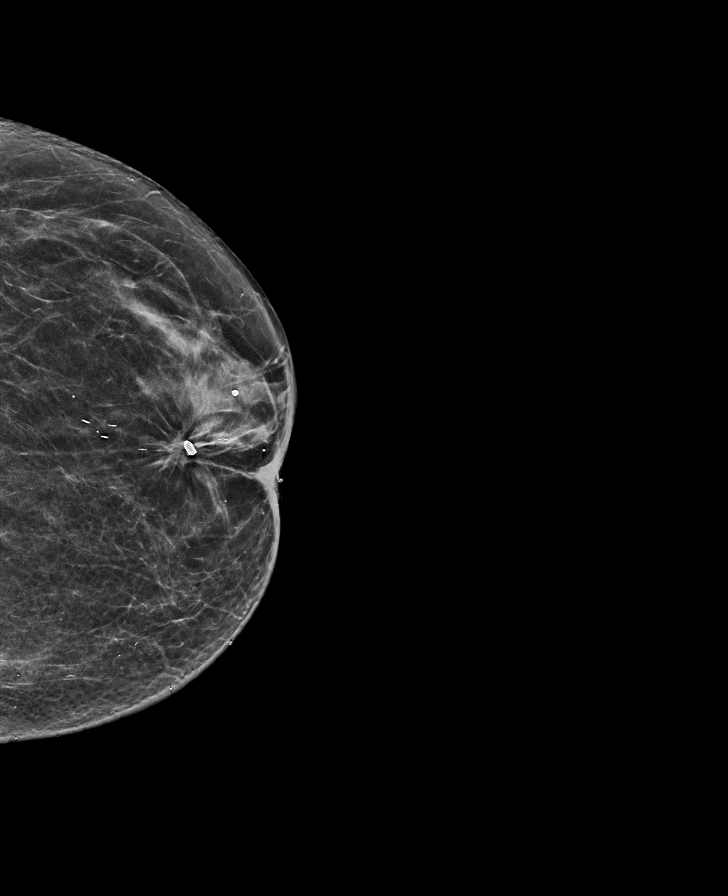

[L CC tomo · tomo slice 32/63.0]
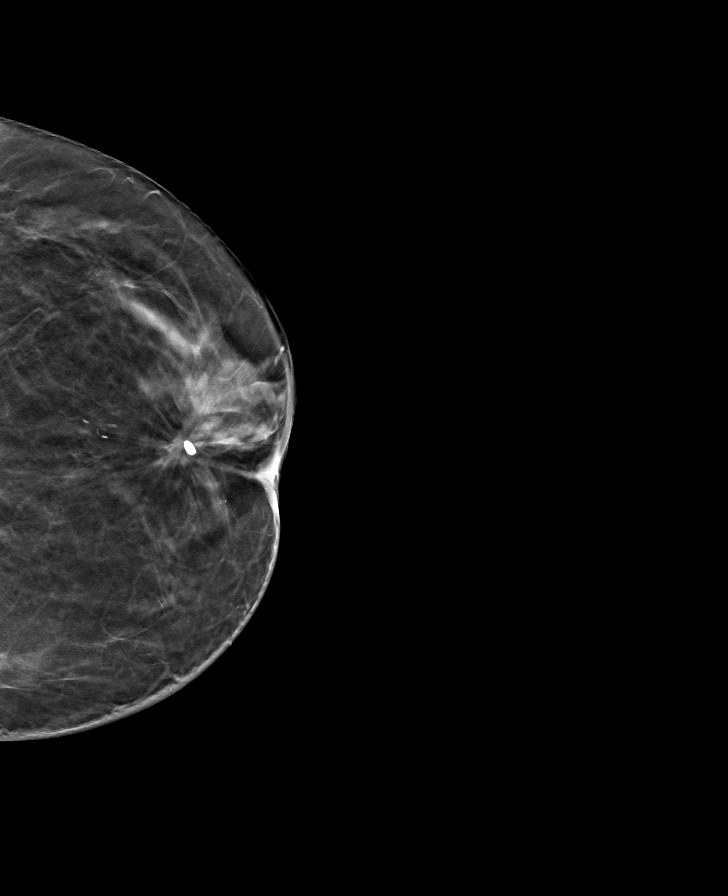

[R MLO tomo · tomo slice 41/81.0]
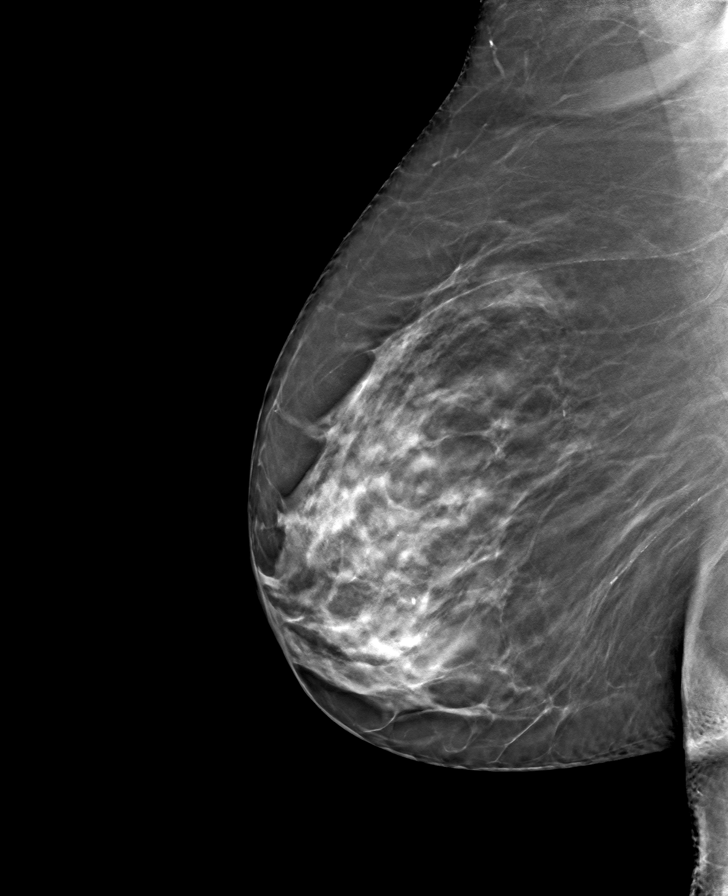

[R CC tomo · tomo slice 35/70.0]
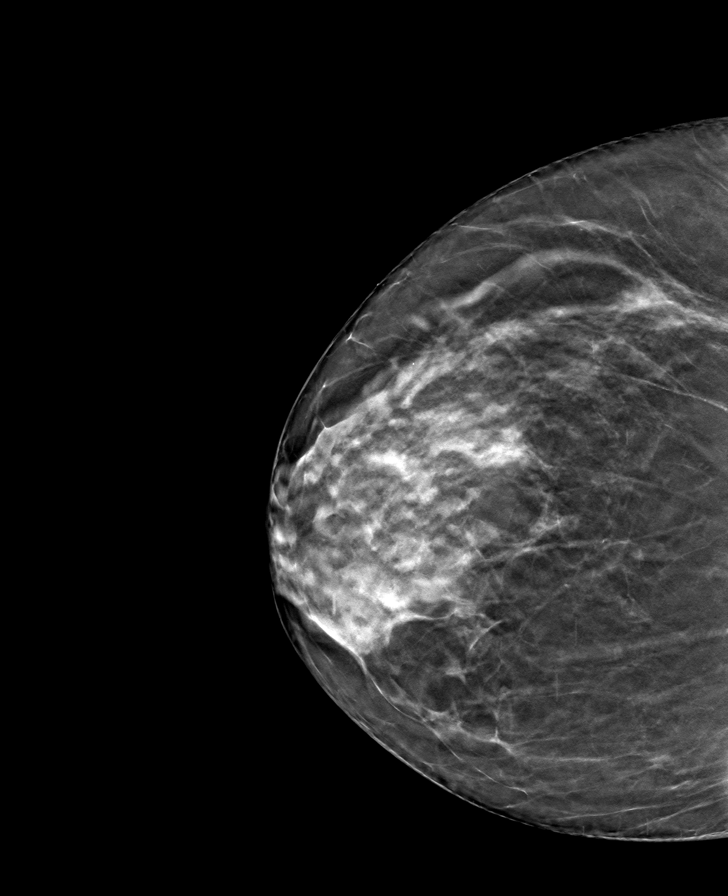

[L MLO tomo · tomo slice 43/84.0]
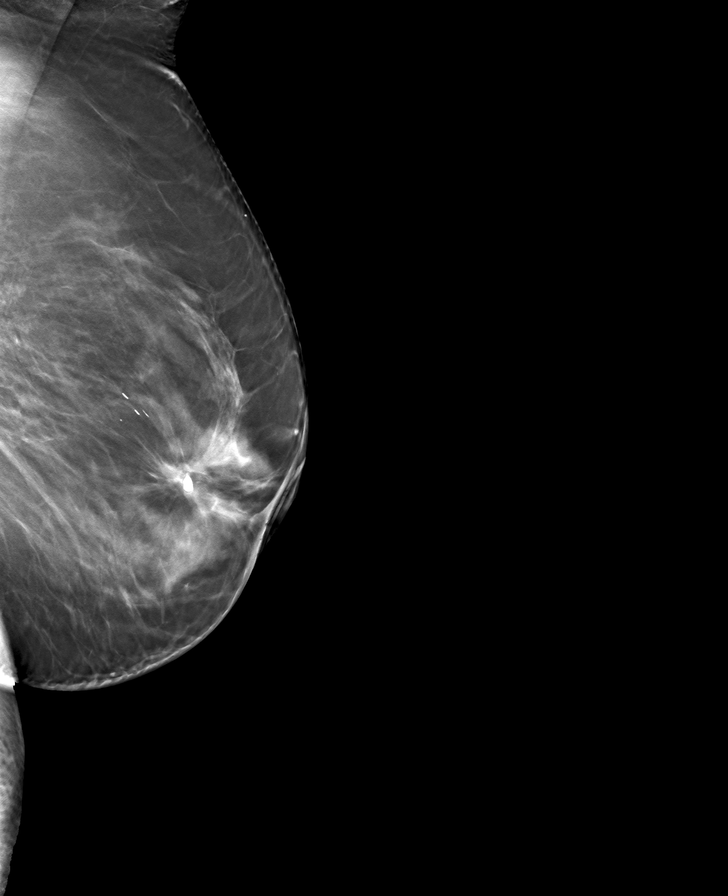

[8 of 24 positions shown; findings below may reference images not displayed]

ACR Breast Density Category b: There are scattered areas of
fibroglandular density.
FINDINGS: There are no findings suspicious for malignancy.
IMPRESSION: No mammographic evidence of malignancy. A result letter of this
screening mammogram will be mailed directly to the patient.

RECOMMENDATION:
Screening mammogram in one year. (Code:51-O-LD2)

BI-RADS CATEGORY  1: Negative.

## 2022-04-17 ENCOUNTER — Other Ambulatory Visit: Payer: Self-pay | Admitting: Family Medicine

## 2022-04-17 ENCOUNTER — Ambulatory Visit
Admission: RE | Admit: 2022-04-17 | Discharge: 2022-04-17 | Disposition: A | Discharge status: Home / Self Care | Payer: Medicare PPO | Source: Ambulatory Visit | Attending: Family Medicine | Admitting: Family Medicine

## 2022-04-17 DIAGNOSIS — G8929 Other chronic pain: Secondary | ICD-10-CM

## 2022-06-08 ENCOUNTER — Ambulatory Visit: Payer: Medicare PPO | Admitting: Podiatry

## 2022-06-08 DIAGNOSIS — L600 Ingrowing nail: Secondary | ICD-10-CM

## 2022-06-08 NOTE — Patient Instructions (Signed)
You can use UREA NAIL GEL on the thicker toenails

## 2022-06-14 NOTE — Progress Notes (Signed)
Subjective: Chief Complaint  Patient presents with   Nail Problem    est - great toe ingrown   77 year old female presents for above concerns.  She is the nails grow back and have going to the side that he injured skin.  There is no edema, erythema or drainage or pus that she reports.  No further concerns.  No injuries.  Objective: AAO x3, NAD DP/PT pulses palpable bilaterally, CRT less than 3 seconds Bilateral hallux toenails of the right side worse than left are hypertrophic, dystrophic with yellow discoloration.  It appears that the nail is turning more to the distal half of the toenail of the proximal half is not significantly ingrown at the distal aspect is also loose from the nailbed.  There is no edema, erythema, drainage or pus.  No significant pain on exam today. No pain with calf compression, swelling, warmth, erythema  Assessment: Ingrown toenail with onychodystrophy  Plan: -All treatment options discussed with the patient including all alternatives, risks, complications.  -We discussed partial nail avulsion versus conservative treatment, debridement.  Given that the distal portion as well as becoming ingrown but also loose I sharply debrided the nail and file it down I was able to remove significant portion of the toenail.  Remaining nail does not appear to be growing into the skin.  We offered partial nail avulsion today but if symptoms persist could reconsider this.  Monitor for any signs or symptoms of infection. -Discussed urea nail gel.  -Patient encouraged to call the office with any questions, concerns, change in symptoms.   Vivi Barrack DPM

## 2022-08-10 ENCOUNTER — Other Ambulatory Visit: Payer: Self-pay | Admitting: Family Medicine

## 2022-08-10 DIAGNOSIS — M8588 Other specified disorders of bone density and structure, other site: Secondary | ICD-10-CM

## 2022-08-14 ENCOUNTER — Other Ambulatory Visit: Payer: Self-pay | Admitting: Family Medicine

## 2022-08-14 DIAGNOSIS — Z1231 Encounter for screening mammogram for malignant neoplasm of breast: Secondary | ICD-10-CM

## 2022-11-19 ENCOUNTER — Other Ambulatory Visit (HOSPITAL_BASED_OUTPATIENT_CLINIC_OR_DEPARTMENT_OTHER): Payer: Self-pay

## 2022-11-19 MED ORDER — COMIRNATY 30 MCG/0.3ML IM SUSY
0.3000 mL | PREFILLED_SYRINGE | Freq: Once | INTRAMUSCULAR | 0 refills | Status: AC
  Filled 2022-11-19: qty 0.3, 1d supply, fill #0

## 2023-03-01 ENCOUNTER — Other Ambulatory Visit: Payer: Self-pay | Admitting: Family Medicine

## 2023-03-01 ENCOUNTER — Ambulatory Visit
Admission: RE | Admit: 2023-03-01 | Discharge: 2023-03-01 | Disposition: A | Discharge status: Home / Self Care | Payer: Medicare PPO | Source: Ambulatory Visit | Attending: Family Medicine | Admitting: Family Medicine

## 2023-03-01 DIAGNOSIS — Z1231 Encounter for screening mammogram for malignant neoplasm of breast: Secondary | ICD-10-CM

## 2023-03-01 DIAGNOSIS — M8588 Other specified disorders of bone density and structure, other site: Secondary | ICD-10-CM

## 2023-03-01 DIAGNOSIS — N644 Mastodynia: Secondary | ICD-10-CM

## 2023-05-28 ENCOUNTER — Ambulatory Visit: Admitting: Internal Medicine

## 2023-05-28 ENCOUNTER — Encounter: Payer: Self-pay | Admitting: Internal Medicine

## 2023-05-28 VITALS — BP 138/80 | HR 20 | Temp 97.4°F | Resp 20 | Ht 66.5 in | Wt 189.8 lb

## 2023-05-28 DIAGNOSIS — Z853 Personal history of malignant neoplasm of breast: Secondary | ICD-10-CM | POA: Diagnosis not present

## 2023-05-28 DIAGNOSIS — M1990 Unspecified osteoarthritis, unspecified site: Secondary | ICD-10-CM

## 2023-05-28 DIAGNOSIS — E7849 Other hyperlipidemia: Secondary | ICD-10-CM

## 2023-05-28 DIAGNOSIS — E039 Hypothyroidism, unspecified: Secondary | ICD-10-CM | POA: Diagnosis not present

## 2023-05-28 DIAGNOSIS — I10 Essential (primary) hypertension: Secondary | ICD-10-CM

## 2023-05-28 DIAGNOSIS — M810 Age-related osteoporosis without current pathological fracture: Secondary | ICD-10-CM | POA: Diagnosis not present

## 2023-05-28 NOTE — Progress Notes (Signed)
 Location:   [facility]   Place of Service:  Clinic   Provider:   Code Status:  Goals of Care:     05/28/2023    8:34 AM  Advanced Directives  Does Patient Have a Medical Advance Directive? Yes  Type of Estate agent of [place];Living will;Out of facility DNR (pink MOST or yellow form)  Does patient want to make changes to medical advance directive? No - Patient declined  Copy of Healthcare Power of Attorney in Chart? No - copy requested     Chief Complaint  Patient presents with   New Patient (Initial Visit)    HPI: Patient is a 78 y.o. female seen today for medical management of chronic diseases.    Discussed the use of AI scribe software for clinical note transcription with the patient, who gave verbal consent to proceed.  Osteoporosis She experienced reaction to Prolia after the third dose, with sharp leg and hip pain, leading to immobility for two weeks. She discontinued Prolia and previously stopped Fosamax due to leg pain. Her bone density scores have improved to 2.1 in LS spine from 2.6 She also has not tolerated Fosamax in the past  H/o Breast cancer   She has a history of breast cancer treated with lumpectomy, chemotherapy, and radiation in 2000.  She does not follow up with an oncologist but has regular mammograms.was ER negative  Knee Arthritis She has a torn meniscus in both knees, with a failed repair on the left knee. She receives injections for knee pain and can walk long distances without a cane.    She takes medications for cholesterol, blood pressure, and thyroid.   Urinary Incontinence  She recently had a urinary tract infection and is on Antibiotics  She experiences nocturia, urinating three to four times at night, which she attributes to a weak bladder.  She a good appetite, and regular bowel movements. She sees an eye doctor annually and a dentist every six months.      Past Medical History:  Diagnosis Date   Breast  cancer (HCC) 07/18/1998   Left   Cancer (HCC)    Cataract    Personal history of chemotherapy 2000   Personal history of radiation therapy 2001   Left    Past Surgical History:  Procedure Laterality Date   ABDOMINAL HYSTERECTOMY     BREAST BIOPSY     BREAST LUMPECTOMY Left 07/18/1998    No Known Allergies  Outpatient Encounter Medications as of 05/28/2023  Medication Sig   aspirin 81 MG tablet Take 81 mg by mouth daily.   atorvastatin (LIPITOR) 10 MG tablet    cephALEXin  (KEFLEX ) 500 MG capsule Take 1 capsule (500 mg total) by mouth 3 (three) times daily.   Cholecalciferol (VITAMIN D PO) Take by mouth.   COVID-19 mRNA vaccine 2023-2024 (COMIRNATY ) syringe Inject into the muscle.   COVID-19 mRNA vaccine, Moderna, 100 MCG/0.5ML injection Inject into the muscle.   L-LYSINE PO Take by mouth.   levothyroxine (SYNTHROID, LEVOTHROID) 75 MCG tablet    metoprolol succinate (TOPROL-XL) 50 MG 24 hr tablet    Tdap (BOOSTRIX ) 5-2.5-18.5 LF-MCG/0.5 injection Inject into the muscle.   [DISCONTINUED] alendronate (FOSAMAX) 70 MG tablet    No facility-administered encounter medications on file as of 05/28/2023.    Review of Systems:  Review of Systems  Constitutional:  Negative for activity change and appetite change.  HENT: Negative.    Respiratory:  Negative for cough and shortness of breath.  Cardiovascular:  Negative for leg swelling.  Gastrointestinal:  Negative for constipation.  Genitourinary: Negative.   Musculoskeletal:  Negative for arthralgias, gait problem and myalgias.  Skin: Negative.   Neurological:  Negative for dizziness and weakness.  Psychiatric/Behavioral:  Negative for confusion, dysphoric mood and sleep disturbance.     Health Maintenance  Topic Date Due   Hepatitis C Screening  Never done   Pneumonia Vaccine 70+ Years old (1 of 2 - PCV) Never done   COVID-19 Vaccine (7 - Moderna risk 2024-25 season) 11/29/2023 (Originally 05/20/2023)   Medicare Annual Wellness  (AWV)  08/09/2023   INFLUENZA VACCINE  08/30/2023   DTaP/Tdap/Td (2 - Td or Tdap) 02/07/2032   DEXA SCAN  Completed   Zoster Vaccines- Shingrix  Completed   HPV VACCINES  Aged Out   Meningococcal B Vaccine  Aged Out    Physical Exam: Vitals:   05/28/23 0820 05/28/23 0834  BP: (!) 140/78 138/80  Pulse: (!) 20   Resp: 20   Temp: (!) 97.4 F (36.3 C)   SpO2: 98%   Weight: 189 lb 12.8 oz (86.1 kg)   Height: 5' 6.5" (1.689 m)    Body mass index is 30.18 kg/m. Physical Exam Vitals reviewed.  Constitutional:      Appearance: Normal appearance.  HENT:     Head: Normocephalic.     Right Ear: Tympanic membrane normal.     Left Ear: Tympanic membrane normal.     Nose: Nose normal.     Mouth/Throat:     Mouth: Mucous membranes are moist.     Pharynx: Oropharynx is clear.  Eyes:     Pupils: Pupils are equal, round, and reactive to light.  Cardiovascular:     Rate and Rhythm: Normal rate and regular rhythm.     Pulses: Normal pulses.     Heart sounds: Normal heart sounds. No murmur heard. Pulmonary:     Effort: Pulmonary effort is normal.     Breath sounds: Normal breath sounds.  Abdominal:     General: Abdomen is flat. Bowel sounds are normal.     Palpations: Abdomen is soft.  Musculoskeletal:        General: No swelling.     Cervical back: Neck supple.  Skin:    General: Skin is warm.  Neurological:     General: No focal deficit present.     Mental Status: She is alert and oriented to person, place, and time.  Psychiatric:        Mood and Affect: Mood normal.        Thought Content: Thought content normal.     Labs reviewed: Basic Metabolic Panel: No results for input(s): "NA", "K", "CL", "CO2", "GLUCOSE", "BUN", "CREATININE", "CALCIUM", "MG", "PHOS", "TSH" in the last 8760 hours. Liver Function Tests: No results for input(s): "AST", "ALT", "ALKPHOS", "BILITOT", "PROT", "ALBUMIN" in the last 8760 hours. No results for input(s): "LIPASE", "AMYLASE" in the last  8760 hours. No results for input(s): "AMMONIA" in the last 8760 hours. CBC: No results for input(s): "WBC", "NEUTROABS", "HGB", "HCT", "MCV", "PLT" in the last 8760 hours. Lipid Panel: No results for input(s): "CHOL", "HDL", "LDLCALC", "TRIG", "CHOLHDL", "LDLDIRECT" in the last 8760 hours. No results found for: "HGBA1C"  Procedures since last visit: No results found.  Assessment/Plan 1. Age-related osteoporosis without current pathological fracture (Primary) Unfortunately has not tolerated Biphosphonates Discussed how important it is to stay on Prolia Will get it approved from our office  2. History of breast cancer  Has not followed with Oncology Will Continue Annual Mammogram and Manual Breast exam next visit  3. Other hyperlipidemia On statin  4. Acquired hypothyroidism Synthroid  5. Primary hypertension Metoprolol  6. Arthritis Sees Dr Woodie Hazard office    Labs/tests ordered:  Labs before [place] shot schedule Not sure of date yet Next appt:  Visit date not found

## 2023-05-30 ENCOUNTER — Encounter: Payer: Self-pay | Admitting: Internal Medicine

## 2023-06-03 NOTE — Telephone Encounter (Signed)
 I added these medications to patient's current medication list as requested by patient.

## 2023-06-06 ENCOUNTER — Telehealth: Payer: Self-pay | Admitting: *Deleted

## 2023-06-06 DIAGNOSIS — M810 Age-related osteoporosis without current pathological fracture: Secondary | ICD-10-CM

## 2023-06-06 MED ORDER — DENOSUMAB 60 MG/ML ~~LOC~~ SOSY
60.0000 mg | PREFILLED_SYRINGE | Freq: Once | SUBCUTANEOUS | Status: AC
  Administered 2023-06-27: 60 mg via SUBCUTANEOUS

## 2023-06-06 NOTE — Telephone Encounter (Signed)
 Dr. Venice Gillis requested Prolia Verification for Patient.  Initiated through [employer]   Called [employer] [phone] to get Prior Approval and after an hour of being transferred to different people in the network [place]nell Degree, [place], [place], [place] and [place])  was disconnected.   Initiated Prior Authorization through [employer] [id number]  Submitted, awaiting Determination.  Order placed in [facility]

## 2023-06-07 NOTE — Telephone Encounter (Signed)
 Received Fax from [facility] and Prolia  was APPROVED 06/06/2023-01/29/2024  Reference #:[id number]

## 2023-06-10 NOTE — Telephone Encounter (Signed)
 Verification Received through [employer] Copay $40 and PA Approved through [facility].

## 2023-06-10 NOTE — Telephone Encounter (Signed)
 Tried calling patient to schedule an appointment. No Answer.

## 2023-06-11 DIAGNOSIS — N390 Urinary tract infection, site not specified: Secondary | ICD-10-CM | POA: Diagnosis not present

## 2023-06-11 NOTE — Telephone Encounter (Signed)
 Patient scheduled an appointment at the office for her Prolia  Injection on 06/27/2023. Patient aware of Copay and that she will need labs prior.    Please Place Order for labs and schedule her an appointment there at [facility] for labs.

## 2023-06-12 NOTE — Telephone Encounter (Signed)
 Her CMP would be done tomorrow

## 2023-06-13 DIAGNOSIS — Z5181 Encounter for therapeutic drug level monitoring: Secondary | ICD-10-CM | POA: Diagnosis not present

## 2023-06-27 ENCOUNTER — Ambulatory Visit (INDEPENDENT_AMBULATORY_CARE_PROVIDER_SITE_OTHER): Admitting: Internal Medicine

## 2023-06-27 ENCOUNTER — Other Ambulatory Visit: Payer: Self-pay | Admitting: Internal Medicine

## 2023-06-27 DIAGNOSIS — M810 Age-related osteoporosis without current pathological fracture: Secondary | ICD-10-CM

## 2023-06-27 MED ORDER — DENOSUMAB 60 MG/ML ~~LOC~~ SOSY: 60.0000 mg | PREFILLED_SYRINGE | SUBCUTANEOUS | Status: AC

## 2023-06-27 NOTE — Progress Notes (Unsigned)
 Patient is in office today for a nurse visit for  Prolia Injection . Patient Injection was given in the  Right arm. Patient tolerated injection well.

## 2023-08-12 ENCOUNTER — Encounter: Admitting: Adult Health

## 2023-10-31 ENCOUNTER — Non-Acute Institutional Stay (SKILLED_NURSING_FACILITY): Payer: Self-pay | Admitting: Adult Health

## 2023-10-31 DIAGNOSIS — I10 Essential (primary) hypertension: Secondary | ICD-10-CM

## 2023-10-31 DIAGNOSIS — Z96651 Presence of right artificial knee joint: Secondary | ICD-10-CM

## 2023-10-31 DIAGNOSIS — M1711 Unilateral primary osteoarthritis, right knee: Secondary | ICD-10-CM | POA: Diagnosis not present

## 2023-10-31 DIAGNOSIS — E7849 Other hyperlipidemia: Secondary | ICD-10-CM | POA: Diagnosis not present

## 2023-10-31 DIAGNOSIS — E039 Hypothyroidism, unspecified: Secondary | ICD-10-CM

## 2023-10-31 DIAGNOSIS — K5903 Drug induced constipation: Secondary | ICD-10-CM

## 2023-10-31 NOTE — Progress Notes (Signed)
 Location:  Medical illustrator of Service:  SNF ([id number]) Provider:    Goals of Care:     05/28/2023    8:34 AM  Advanced Directives  Does Patient Have a Medical Advance Directive? Yes  Type of Estate agent of [place];Living will;Out of facility DNR (pink MOST or yellow form)  Does patient want to make changes to medical advance directive? No - Patient declined  Copy of Healthcare Power of Attorney in Chart? No - copy requested     Chief Complaint  Patient presents with   hospital followup    HPI: The patient is a 78 year old who presents for follow-up after right total knee arthroplasty performed by Dr Rubie at the surgical center.   Postoperative right knee pain - Underwent right total knee arthroplasty one day ago - Experiencing pain predominantly in the posterior aspect of the right knee, less in the anterior aspect - Pain managed with ice, Tylenol, and oxycodone  Postoperative functional status - Currently in rehabilitation following surgery - Able to ambulate with therapy and perform prescribed exercises - Resides in independent living  Constipation - No bowel movement since surgery - Constipation likely related to postoperative medications - Using Miralax for management  Venous thromboembolism prophylaxis - Taking aspirin twice daily for DVT prophylaxis - Wearing compression hose  Review of systems - No calf pain - No shortness of breath - No difficulty breathing  Past Medical History:  Diagnosis Date   Breast cancer (HCC) 07/18/1998   Left   Cancer (HCC)    Cataract    Personal history of chemotherapy 2000   Personal history of radiation therapy 2001   Left    Past Surgical History:  Procedure Laterality Date   ABDOMINAL HYSTERECTOMY     BREAST BIOPSY     BREAST LUMPECTOMY Left 07/18/1998    No Known Allergies  Outpatient Encounter Medications as of 10/31/2023  Medication Sig   acetaminophen  (TYLENOL) 500 MG tablet Take 1,000 mg by mouth every 6 (six) hours as needed.   amLODipine (NORVASC) 5 MG tablet Take 5 mg by mouth daily.   aspirin 81 MG tablet Take 81 mg by mouth in the morning and at bedtime.   atorvastatin (LIPITOR) 10 MG tablet    Coenzyme Q10 (CO Q10) 100 MG CAPS Take 200 mg by mouth daily.   docusate sodium (COLACE) 100 MG capsule Take 100 mg by mouth 2 (two) times daily.   fexofenadine (ALLEGRA) 180 MG tablet Take 180 mg by mouth daily.   L-LYSINE PO Take by mouth.   levothyroxine (SYNTHROID, LEVOTHROID) 75 MCG tablet    methocarbamol (ROBAXIN) 500 MG tablet Take 500 mg by mouth every 6 (six) hours as needed for muscle spasms.   metoprolol succinate (TOPROL-XL) 50 MG 24 hr tablet    omeprazole (PRILOSEC OTC) 20 MG tablet Take 20 mg by mouth daily.   ondansetron (ZOFRAN) 4 MG tablet Take 4 mg by mouth every 8 (eight) hours as needed for nausea or vomiting.   oxycodone (OXY-IR) 5 MG capsule Take 5-10 mg by mouth every 6 (six) hours as needed.   polyethylene glycol (MIRALAX / GLYCOLAX) 17 g packet Take 17 g by mouth daily as needed for mild constipation.   cephALEXin  (KEFLEX ) 500 MG capsule Take 1 capsule (500 mg total) by mouth 3 (three) times daily.   COVID-19 mRNA vaccine, Moderna, 100 MCG/0.5ML injection Inject into the muscle.   [DISCONTINUED] Cholecalciferol (VITAMIN D PO)  Take by mouth.   [DISCONTINUED] COVID-19 mRNA vaccine 2023-2024 (COMIRNATY ) syringe Inject into the muscle.   [DISCONTINUED] Tdap (BOOSTRIX ) 5-2.5-18.5 LF-MCG/0.5 injection Inject into the muscle.   Facility-Administered Encounter Medications as of 10/31/2023  Medication   [START ON 12/24/2023] denosumab  (PROLIA ) injection 60 mg    Review of Systems:  Review of Systems  Constitutional:  Positive for activity change. Negative for appetite change, chills, diaphoresis, fatigue and fever.  HENT:  Negative for congestion.   Respiratory:  Negative for cough, shortness of breath and wheezing.    Cardiovascular:  Negative for chest pain and leg swelling.  Gastrointestinal:  Positive for constipation. Negative for abdominal distention, abdominal pain, diarrhea, nausea and vomiting.  Genitourinary:  Negative for difficulty urinating, dysuria and urgency.  Musculoskeletal:  Positive for arthralgias, gait problem and joint swelling. Negative for back pain, myalgias and neck pain.  Skin:  Negative for rash.  Neurological:  Negative for dizziness and weakness.  Psychiatric/Behavioral:  Negative for confusion.     Health Maintenance  Topic Date Due   Hepatitis C Screening  Never done   Medicare Annual Wellness (AWV)  08/09/2023   Influenza Vaccine  08/30/2023   COVID-19 Vaccine (7 - Moderna risk 2024-25 season) 09/30/2023   DTaP/Tdap/Td (2 - Td or Tdap) 02/07/2032   Pneumococcal Vaccine: 50+ Years  Completed   DEXA SCAN  Completed   Zoster Vaccines- Shingrix  Completed   HPV VACCINES  Aged Out   Meningococcal B Vaccine  Aged Out   Mammogram  Discontinued    Physical Exam: Vitals:   10/31/23 1846  BP: 121/71  Pulse: 85  Resp: 16  Temp: 98.9 F (37.2 C)  Weight: 202 lb (91.6 kg)   Body mass index is 32.12 kg/m. Physical Exam Vitals and nursing note reviewed.  Constitutional:      General: She is not in acute distress.    Appearance: She is not diaphoretic.  HENT:     Head: Normocephalic and atraumatic.  Neck:     Vascular: No JVD.  Cardiovascular:     Rate and Rhythm: Normal rate and regular rhythm.     Heart sounds: No murmur heard. Pulmonary:     Effort: Pulmonary effort is normal. No respiratory distress.     Breath sounds: Normal breath sounds. No wheezing.  Abdominal:     General: Bowel sounds are normal. There is no distension.     Palpations: Abdomen is soft.     Tenderness: There is no abdominal tenderness.  Musculoskeletal:     Right lower leg: Edema (trace) present.     Left lower leg: No edema.     Comments: RLE pos pedal pulse, normal sensation  and motion Trace edema.  Dressing and ice pack in place.   Skin:    General: Skin is warm and dry.  Neurological:     Mental Status: She is alert and oriented to person, place, and time.  Psychiatric:        Mood and Affect: Mood normal.     Labs reviewed: Basic Metabolic Panel: No results for input(s): NA, K, CL, CO2, GLUCOSE, BUN, CREATININE, CALCIUM, MG, PHOS, TSH in the last 8760 hours. Liver Function Tests: No results for input(s): AST, ALT, ALKPHOS, BILITOT, PROT, ALBUMIN in the last 8760 hours. No results for input(s): LIPASE, AMYLASE in the last 8760 hours. No results for input(s): AMMONIA in the last 8760 hours. CBC: No results for input(s): WBC, NEUTROABS, HGB, HCT, MCV, PLT in the last 8760  hours. Lipid Panel: No results for input(s): CHOL, HDL, LDLCALC, TRIG, CHOLHDL, LDLDIRECT in the last 8760 hours. No results found for: HGBA1C  Procedures since last visit: DG BONE DENSITY (DXA) Result Date: 03/07/2023 EXAM: DUAL X-RAY ABSORPTIOMETRY (DXA) FOR BONE MINERAL DENSITY IMPRESSION: Referring Physician:  JOSETTE MICAEL AHO Your patient completed a bone mineral density test using GE Lunar iDXA system (analysis version: 16). Technologist: BEC PATIENT: Name: [place], Sanchez Patient ID: [id number] Birth Date: January 10, 1946 Height: 65.5 in. Sex: Female Measured: 03/01/2023 Weight: 191.0 lbs. Indications: Advanced Age, Bilateral Oophorectomy (65.51), Breast Cancer History, Caucasian, Estrogen Deficient, History of Fracture (Adult) (V15.51), Hypothyroid, Hysterectomy, Levothyroxine, Postmenopausal Fractures: Right Ankle Treatments: Calcium (E943.0), Prolia , Vitamin D (E933.5) ASSESSMENT: The BMD measured at AP Spine L3-L4 is 0.944 g/cm2 with a T-score of -2.1. This patient is considered osteopenic/low bone mass according to World Health Organization [facility]) criteria. L1 & L2 were excluded due to being excluded from the prior exam.  FRAX was not calculated because of patient's current use of Prolia . The quality of the exam is good. Site Region Measured Date Measured Age YA BMD Significant CHANGE T-score AP Spine  L3-L4      2/01/20[age]         -2.1    0.944 g/cm2 AP Spine  L3-L4      12/13/2020    [age]         -2.3    0.929 g/cm2 DualFemur Neck Right 2/01/2023    77.9         -1.5    0.824 g/cm2 DualFemur Neck Right 12/13/2020    76.6         -1.5    0.824 g/cm2 DualFemur Total Mean 2/01/2023    78.9         -1.4    0.834 g/cm2 DualFemur Total Mean 12/13/2020    76.6         -1.4    0.833 g/cm2 World Health Organization [facility]) criteria for post-menopausal, Caucasian Women: Normal       T-score at or above -1 SD Osteopenia   T-score between -1 and -2.5 SD Osteoporosis T-score at or below -2.5 SD RECOMMENDATION: 1. All patients should optimize calcium and vitamin D intake. 2. Consider FDA-approved medical therapies in postmenopausal women and men aged 42 years and older, based on the following: a. A hip or vertebral (clinical or morphometric) fracture. b. T-score = -2.5 at the femoral neck or spine after appropriate evaluation to exclude secondary causes. c. Low bone mass (T-score between -1.0 and -2.5 at the femoral neck or spine) and a 10-year probability of a hip fracture = 3% or a 10-year probability of a major osteoporosis-related fracture = 20% based on the [place] -adapted WHO algorithm. d. Clinician judgment and/or patient preferences may indicate treatment for people with 10-year fracture probabilities above or below these levels. FOLLOW-UP: Patients with diagnosis of osteoporosis or at high risk for fracture should have regular bone mineral density tests.? Patients eligible for Medicare are allowed routine testing every 2 years.? The testing frequency can be increased to one year for patients who have rapidly progressing disease, are receiving or discontinuing medical therapy to restore bone mass, or have additional risk factors. I  have reviewed this study and agree with the findings. [facility] Electronically Signed   By: Norman Hopper M.D.   On: 03/07/2023 08:54   MM 3D DIAGNOSTIC MAMMOGRAM BILATERAL BREAST Result Date: 03/01/2023 CLINICAL DATA:  Patient complaining of  lateral left breast pain. History of a lumpectomy for breast carcinoma 2020. EXAM: DIGITAL DIAGNOSTIC BILATERAL MAMMOGRAM WITH TOMOSYNTHESIS AND CAD TECHNIQUE: Bilateral digital diagnostic mammography and breast tomosynthesis was performed. The images were evaluated with computer-aided detection. COMPARISON:  Previous exam(s). ACR Breast Density Category c: The breasts are heterogeneously dense, which may obscure small masses. FINDINGS: Stable post lumpectomy changes on the left. There are no breast masses, areas of significant asymmetry, areas of nonsurgical architectural distortion or suspicious calcifications. No mammographic change. IMPRESSION: 1. No evidence of breast malignancy. 2. Benign, stable post lumpectomy changes on the left. RECOMMENDATION: Screening mammogram in one year.(Code:[id number]-B-01Y) I have discussed the findings and recommendations with the patient. If applicable, a reminder letter will be sent to the patient regarding the next appointment. BI-RADS CATEGORY  2: Benign. Electronically Signed   By: Alm Parkins M.D.   On: 03/01/2023 08:53    Assessment/Plan Right total knee arthroplasty, post-operative care Post-operative care ongoing with rehabilitation. - Continue physical and occupational therapy. - Follow up with the surgeon as indicated.  Acute postprocedural pain after right total knee arthroplasty Pain managed with Tylenol and oxycodone. - Continue Tylenol and oxycodone for pain management. - Apply ice to the back of the knee for pain relief.  Deep vein thrombosis prophylaxis Prophylaxis with aspirin and compression hose. No DVT signs present. - Continue aspirin twice daily for DVT prophylaxis. - Continue wearing  compression hose.  Constipation Constipation likely due to opioid use. No bowel movement since surgery. - Use Miralax for constipation.  Hypothyroidism Continue levothyroxine  HTN Controlled

## 2023-11-05 ENCOUNTER — Other Ambulatory Visit: Payer: Self-pay | Admitting: Orthopedic Surgery

## 2023-11-05 MED ORDER — OXYCODONE HCL 5 MG PO CAPS: 5.0000 mg | ORAL_CAPSULE | Freq: Four times a day (QID) | ORAL | 0 refills | Status: AC | PRN

## 2023-11-22 ENCOUNTER — Other Ambulatory Visit: Payer: Self-pay

## 2023-11-22 ENCOUNTER — Telehealth: Payer: Self-pay | Admitting: *Deleted

## 2023-11-22 ENCOUNTER — Other Ambulatory Visit (HOSPITAL_COMMUNITY): Payer: Self-pay

## 2023-11-22 MED ORDER — DENOSUMAB 60 MG/ML ~~LOC~~ SOSY
60.0000 mg | PREFILLED_SYRINGE | SUBCUTANEOUS | 1 refills | Status: AC
  Filled 2023-11-22: qty 1, 180d supply, fill #0

## 2023-11-22 NOTE — Progress Notes (Signed)
 Pharmacy Patient Advocate Encounter  Insurance verification completed.   The patient is insured through [employer]   Ran test claim for Prolia. Co-pay is $64.  This test claim was processed through [facility]- copay amounts may vary at other pharmacies due to pharmacy/plan contracts, or as the patient moves through the different stages of their insurance plan.

## 2023-11-22 NOTE — Telephone Encounter (Signed)
 Submitted Verification through [employer] for Prolia  Sent Rx to [facility] to [facility].

## 2023-11-26 ENCOUNTER — Other Ambulatory Visit: Payer: Self-pay

## 2023-12-03 ENCOUNTER — Encounter: Admitting: Internal Medicine

## 2023-12-09 ENCOUNTER — Other Ambulatory Visit: Payer: Self-pay

## 2023-12-09 NOTE — Telephone Encounter (Signed)
 Putman, Tiffany L, CPhT Pharmacologist)             Pharmacy Patient Advocate Encounter   Insurance verification completed.   The patient is insured through [facility]    Ran test claim for Prolia . Co-pay is $64.       Message sent to Tiffany to confirm shipment of Prolia  and to see if benefits were ran for [place]. Awaiting Response.

## 2023-12-10 ENCOUNTER — Other Ambulatory Visit: Payer: Self-pay

## 2023-12-10 NOTE — Telephone Encounter (Signed)
 [place], CPhT with [facility] contacted patient regarding the Prolia  Injection and patient stated:  she says she is actually going to get the injection from another office because its closer for her. I asked her to have that office send a new prescription.  Will remove patient from our Prolia  List.

## 2023-12-11 ENCOUNTER — Other Ambulatory Visit: Payer: Self-pay

## 2023-12-11 NOTE — Progress Notes (Signed)
 Patient will using a different provider and they have already ordered medication for her. Dis-enrolling.

## 2023-12-16 NOTE — Telephone Encounter (Signed)
 Putman, [place] Sanchez, CPhT Pharmacologist)             Patient will using a different provider and they have already ordered medication for her. Dis-enrolling.         Called patient to confirm the Dr. She uses to get her Prolia  through and she stated that she uses a Dr. In [place] and to take us  OFF the List.

## 2023-12-30 ENCOUNTER — Ambulatory Visit

## 2024-01-06 ENCOUNTER — Ambulatory Visit: Admitting: Podiatry

## 2024-01-06 DIAGNOSIS — L600 Ingrowing nail: Secondary | ICD-10-CM | POA: Diagnosis not present

## 2024-01-06 NOTE — Patient Instructions (Addendum)
 You can use [employer] on the thicker toenails -  Ingrown Toenail  An ingrown toenail occurs when the corner or sides of a toenail grow into the surrounding skin. This causes discomfort and pain. The big toe is most commonly affected, but any of the toes can be affected. If an ingrown toenail is not treated, it can become infected. What are the causes? This condition may be caused by: Wearing shoes that are too small or tight. An injury, such as stubbing your toe or having your toe stepped on. Improper cutting or care of your toenails. Having nail or foot abnormalities that were present from birth (congenital abnormalities), such as having a nail that is too big for your toe. What increases the risk? The following factors may make you more likely to develop ingrown toenails: Age. Nails tend to get thicker with age, so ingrown nails are more common among older people. Cutting your toenails incorrectly, such as cutting them very short or cutting them unevenly. An ingrown toenail is more likely to get infected if you have: Diabetes. Blood flow (circulation) problems. What are the signs or symptoms? Symptoms of an ingrown toenail may include: Pain, soreness, or tenderness. Redness. Swelling. Hardening of the skin that surrounds the toenail. Signs that an ingrown toenail may be infected include: Fluid or pus. Symptoms that get worse. How is this diagnosed? Ingrown toenails may be diagnosed based on: Your symptoms and medical history. A physical exam. Labs or tests. If you have fluid or blood coming from your toenail, a sample may be collected to test for the specific type of bacteria that is causing the infection. How is this treated? Treatment depends on the severity of your symptoms. You may be able to care for your toenail at home. If you have an infection, you may be prescribed antibiotic medicines. If you have fluid or pus draining from your toenail, your health care provider  may drain it. If you have trouble walking, you may be given crutches to use. If you have a severe or infected ingrown toenail, you may need a procedure to remove part or all of the nail. Follow these instructions at home: Foot care  Check your wound every day for signs of infection, or as often as told by your health care provider. Check for: More redness, swelling, or pain. More fluid or blood. Warmth. Pus or a bad smell. Do not pick at your toenail or try to remove it yourself. Soak your foot in warm, soapy water. Do this for 20 minutes, 3 times a day, or as often as told by your health care provider. This helps to keep your toe clean and your skin soft. Wear shoes that fit well and are not too tight. Your health care provider may recommend that you wear open-toed shoes while you heal. Trim your toenails regularly and carefully. Cut your toenails straight across to prevent injury to the skin at the corners of the toenail. Do not cut your nails in a curved shape. Keep your feet clean and dry to help prevent infection. General instructions Take over-the-counter and prescription medicines only as told by your health care provider. If you were prescribed an antibiotic, take it as told by your health care provider. Do not stop taking the antibiotic even if you start to feel better. If your health care provider told you to use crutches to help you move around, use them as instructed. Return to your normal activities as told by your health care  provider. Ask your health care provider what activities are safe for you. Keep all follow-up visits. This is important. Contact a health care provider if: You have more redness, swelling, pain, or other symptoms that do not improve with treatment. You have fluid, blood, or pus coming from your toenail. You have a red streak on your skin that starts at your foot and spreads up your leg. You have a fever. Summary An ingrown toenail occurs when the corner  or sides of a toenail grow into the surrounding skin. This causes discomfort and pain. The big toe is most commonly affected, but any of the toes can be affected. If an ingrown toenail is not treated, it can become infected. Fluid or pus draining from your toenail is a sign of infection. Your health care provider may need to drain it. You may be given antibiotics to treat the infection. Trimming your toenails regularly and properly can help you prevent an ingrown toenail. This information is not intended to replace advice given to you by your health care provider. Make sure you discuss any questions you have with your health care provider. Document Revised: 05/17/2020 Document Reviewed: 05/17/2020 [employer]vier Patient Education  2024 [employer].

## 2024-01-13 NOTE — Progress Notes (Signed)
 Subjective: Chief Complaint  Patient presents with   Nail Problem    Bilateral ingrown    78 year old female presents the office the above concerns.  She states that the nails are starting to get ingrown again both of the big toes at the tip of the toes where it becomes tender.  She does not report any swelling, redness or any drainage.  Objective: AAO x3, NAD DP/PT pulses palpable bilaterally, CRT less than 3 seconds Nails are mildly hypertrophic, dystrophic.  There is mild incurvation present to the distal portion nail border is on both big toenails.  There is no edema, erythema or any signs of infection.  No open lesions.  No other areas of discomfort. No pain with calf compression, swelling, warmth, erythema  Assessment: Ingrown toenails bilateral hallux  Plan: -All treatment options discussed with the patient including all alternatives, risks, complications.  -We discussed partial nail avulsion versus debridement.  We mutually agreed to proceed with debridement of the nails today.  I sharply debrided the nails without any complications or bleeding.  Should symptoms continue or worsen we will likely need to proceed with partial nail avulsion. -Patient encouraged to call the office with any questions, concerns, change in symptoms.   Virginia Sanchez DPM

## 2024-02-06 ENCOUNTER — Ambulatory Visit: Admitting: Podiatry
